# Patient Record
Sex: Male | Born: 1935 | Race: White | Hispanic: No | Marital: Married
Health system: Southern US, Community
[De-identification: ages and names within clinical notes are randomized; demographics above are authoritative.]

## PROBLEM LIST (undated history)

## (undated) DIAGNOSIS — G459 Transient cerebral ischemic attack, unspecified: Secondary | ICD-10-CM

## (undated) DIAGNOSIS — C100 Malignant neoplasm of vallecula: Secondary | ICD-10-CM

## (undated) DIAGNOSIS — R112 Nausea with vomiting, unspecified: Secondary | ICD-10-CM

## (undated) DIAGNOSIS — R590 Localized enlarged lymph nodes: Secondary | ICD-10-CM

## (undated) DIAGNOSIS — C801 Malignant (primary) neoplasm, unspecified: Secondary | ICD-10-CM

## (undated) DIAGNOSIS — H269 Unspecified cataract: Secondary | ICD-10-CM

## (undated) DIAGNOSIS — R21 Rash and other nonspecific skin eruption: Secondary | ICD-10-CM

## (undated) DIAGNOSIS — K1230 Oral mucositis (ulcerative), unspecified: Secondary | ICD-10-CM

## (undated) DIAGNOSIS — Z8673 Personal history of transient ischemic attack (TIA), and cerebral infarction without residual deficits: Secondary | ICD-10-CM

## (undated) DIAGNOSIS — M5430 Sciatica, unspecified side: Secondary | ICD-10-CM

## (undated) DIAGNOSIS — G250 Essential tremor: Secondary | ICD-10-CM

## (undated) DIAGNOSIS — Z9109 Other allergy status, other than to drugs and biological substances: Secondary | ICD-10-CM

## (undated) DIAGNOSIS — E785 Hyperlipidemia, unspecified: Secondary | ICD-10-CM

## (undated) DIAGNOSIS — R630 Anorexia: Secondary | ICD-10-CM

## (undated) DIAGNOSIS — I639 Cerebral infarction, unspecified: Secondary | ICD-10-CM

## (undated) HISTORY — DX: Personal history of transient ischemic attack (TIA), and cerebral infarction without residual deficits: Z86.73

## (undated) HISTORY — DX: Nausea with vomiting, unspecified: R11.2

## (undated) HISTORY — DX: Unspecified cataract: H26.9

## (undated) HISTORY — DX: Rash and other nonspecific skin eruption: R21

## (undated) HISTORY — DX: Cerebral infarction, unspecified: I63.9

## (undated) HISTORY — PX: CATARACT EXTRACTION: SUR2

## (undated) HISTORY — PX: TOOTH EXTRACTION: SUR596

## (undated) HISTORY — DX: Oral mucositis (ulcerative), unspecified: K12.30

## (undated) HISTORY — PX: TONSILLECTOMY: SUR1361

## (undated) HISTORY — DX: Other allergy status, other than to drugs and biological substances: Z91.09

## (undated) HISTORY — PX: VASECTOMY: SHX75

---

## 2010-12-23 ENCOUNTER — Other Ambulatory Visit: Payer: Self-pay | Admitting: Family Medicine

## 2010-12-23 DIAGNOSIS — M545 Low back pain: Secondary | ICD-10-CM

## 2010-12-24 ENCOUNTER — Ambulatory Visit
Admission: RE | Admit: 2010-12-24 | Discharge: 2010-12-24 | Disposition: A | Discharge status: Home / Self Care | Payer: Medicare Other | Source: Ambulatory Visit | Attending: Family Medicine | Admitting: Family Medicine

## 2010-12-24 DIAGNOSIS — M545 Low back pain: Secondary | ICD-10-CM

## 2011-07-01 DIAGNOSIS — Z8673 Personal history of transient ischemic attack (TIA), and cerebral infarction without residual deficits: Secondary | ICD-10-CM

## 2011-07-01 HISTORY — DX: Personal history of transient ischemic attack (TIA), and cerebral infarction without residual deficits: Z86.73

## 2011-07-18 ENCOUNTER — Emergency Department (HOSPITAL_COMMUNITY): Payer: Medicare Other

## 2011-07-18 ENCOUNTER — Encounter (HOSPITAL_COMMUNITY): Payer: Self-pay

## 2011-07-18 ENCOUNTER — Inpatient Hospital Stay (HOSPITAL_COMMUNITY)
Admission: EM | Admit: 2011-07-18 | Discharge: 2011-07-19 | DRG: 065 | Disposition: A | Discharge status: Home / Self Care | Payer: Medicare Other | Attending: Family Medicine | Admitting: Family Medicine

## 2011-07-18 ENCOUNTER — Other Ambulatory Visit: Payer: Self-pay

## 2011-07-18 DIAGNOSIS — G252 Other specified forms of tremor: Secondary | ICD-10-CM | POA: Diagnosis present

## 2011-07-18 DIAGNOSIS — I6350 Cerebral infarction due to unspecified occlusion or stenosis of unspecified cerebral artery: Principal | ICD-10-CM | POA: Diagnosis present

## 2011-07-18 DIAGNOSIS — H532 Diplopia: Secondary | ICD-10-CM | POA: Diagnosis present

## 2011-07-18 DIAGNOSIS — G250 Essential tremor: Secondary | ICD-10-CM | POA: Diagnosis present

## 2011-07-18 DIAGNOSIS — G8190 Hemiplegia, unspecified affecting unspecified side: Secondary | ICD-10-CM | POA: Diagnosis present

## 2011-07-18 DIAGNOSIS — R42 Dizziness and giddiness: Secondary | ICD-10-CM

## 2011-07-18 DIAGNOSIS — E7800 Pure hypercholesterolemia, unspecified: Secondary | ICD-10-CM | POA: Diagnosis present

## 2011-07-18 DIAGNOSIS — E785 Hyperlipidemia, unspecified: Secondary | ICD-10-CM | POA: Diagnosis present

## 2011-07-18 HISTORY — DX: Essential tremor: G25.0

## 2011-07-18 LAB — BASIC METABOLIC PANEL
CO2: 27 mEq/L (ref 19–32)
Chloride: 101 mEq/L (ref 96–112)
Creatinine, Ser: 0.94 mg/dL (ref 0.50–1.35)
GFR calc Af Amer: 85 mL/min — ABNORMAL LOW (ref >90)
Potassium: 4.3 mEq/L (ref 3.5–5.1)
Sodium: 136 mEq/L (ref 135–145)

## 2011-07-18 LAB — CBC
MCV: 98.1 fL (ref 78.0–100.0)
Platelets: 197 10*3/uL (ref 150–400)
RBC: 4.26 MIL/uL (ref 4.22–5.81)
RDW: 12.6 % (ref 11.5–15.5)
WBC: 8.4 10*3/uL (ref 4.0–10.5)

## 2011-07-18 LAB — PROTIME-INR
INR: 1.06 (ref 0.00–1.49)
Prothrombin Time: 14 seconds (ref 11.6–15.2)

## 2011-07-18 LAB — APTT: aPTT: 36 seconds (ref 24–37)

## 2011-07-18 MED ORDER — SODIUM CHLORIDE 0.9 % IV SOLN: 250.0000 mL | INTRAVENOUS | Status: DC | PRN

## 2011-07-18 MED ORDER — ACETAMINOPHEN 650 MG RE SUPP: 650.0000 mg | Freq: Four times a day (QID) | RECTAL | Status: DC | PRN

## 2011-07-18 MED ORDER — ENOXAPARIN SODIUM 40 MG/0.4ML ~~LOC~~ SOLN
40.0000 mg | SUBCUTANEOUS | Status: DC
  Administered 2011-07-18: 40 mg via SUBCUTANEOUS
  Filled 2011-07-18 (×3): qty 0.4

## 2011-07-18 MED ORDER — SODIUM CHLORIDE 0.9 % IJ SOLN
3.0000 mL | Freq: Two times a day (BID) | INTRAMUSCULAR | Status: DC
  Administered 2011-07-18 – 2011-07-19 (×2): 3 mL via INTRAVENOUS

## 2011-07-18 MED ORDER — IOHEXOL 350 MG/ML SOLN
100.0000 mL | Freq: Once | INTRAVENOUS | Status: AC | PRN
  Administered 2011-07-18: 100 mL via INTRAVENOUS

## 2011-07-18 MED ORDER — SODIUM CHLORIDE 0.9 % IJ SOLN: 3.0000 mL | INTRAMUSCULAR | Status: DC | PRN

## 2011-07-18 MED ORDER — SIMVASTATIN 20 MG PO TABS
20.0000 mg | ORAL_TABLET | Freq: Every evening | ORAL | Status: DC
  Administered 2011-07-18: 20 mg via ORAL
  Filled 2011-07-18 (×2): qty 1

## 2011-07-18 MED ORDER — CLOPIDOGREL BISULFATE 75 MG PO TABS
75.0000 mg | ORAL_TABLET | Freq: Every day | ORAL | Status: DC
  Administered 2011-07-19: 75 mg via ORAL
  Filled 2011-07-18 (×2): qty 1

## 2011-07-18 MED ORDER — ACETAMINOPHEN 325 MG PO TABS: 650.0000 mg | ORAL_TABLET | Freq: Four times a day (QID) | ORAL | Status: DC | PRN

## 2011-07-18 MED ORDER — PROPRANOLOL HCL 20 MG PO TABS
20.0000 mg | ORAL_TABLET | Freq: Three times a day (TID) | ORAL | Status: DC
  Administered 2011-07-18 – 2011-07-19 (×2): 20 mg via ORAL
  Filled 2011-07-18 (×4): qty 1

## 2011-07-18 NOTE — ED Notes (Signed)
Called Code Stroke  Per RN, Rolly Salter.   MD Linker

## 2011-07-18 NOTE — H&P (Signed)
Derrick [name] is an 78 y.o. male.   Chief Complaint: double vision and dizziness HPI:  78 yo male with history of hyperlipidemia and benign essential tremor who presents with double vision that started today at 1:30pm. Also noted unsteadiness on his feet at the same time. This occurred acutely. No headache or vomiting. Could not walk due to unsteadiness. Went to the eye doctor shortly after where he was sent to the ED via ambulance. No numbness, tingling, no slurring of speech or trouble swallowing. Double vision resolved with eye patch.    Past Medical History  Diagnosis Date  . Essential tremor   . High cholesterol     History reviewed. No pertinent past surgical history.  History reviewed: sister had stroke, father: diabetes Social History:  reports that he has never smoked. He does not have any smokeless tobacco history on file. He reports that he drinks alcohol occasionally 3-4 glasses of wine or beer per week. He reports that he does not use illicit drugs.  Allergies:  Allergies  Allergen Reactions  . Aspirin Hives, Itching and Swelling   Home Medications: Propranolol 20 mg tid Simvastatin 20mg  daily  Results for orders placed during the hospital encounter of 07/18/11 (from the past 48 hour(s))  CBC     Status: Normal   Collection Time   07/18/11  4:29 PM      Component Value Range Comment   WBC 8.4  4.0 - 10.5 (K/uL)    RBC 4.26  4.22 - 5.81 (MIL/uL)    Hemoglobin 14.4  13.0 - 17.0 (g/dL)    HCT 45.4  09.8 - 11.9 (%)    MCV 98.1  78.0 - 100.0 (fL)    MCH 33.8  26.0 - 34.0 (pg)    MCHC 34.4  30.0 - 36.0 (g/dL)    RDW 14.7  82.9 - 56.2 (%)    Platelets 197  150 - 400 (K/uL)   BASIC METABOLIC PANEL     Status: Abnormal   Collection Time   07/18/11  4:29 PM      Component Value Range Comment   Sodium 136  135 - 145 (mEq/L)    Potassium 4.3  3.5 - 5.1 (mEq/L)    Chloride 101  96 - 112 (mEq/L)    CO2 27  19 - 32 (mEq/L)    Glucose, Bld 138 (*) 70 - 99 (mg/dL)    BUN  16  6 - 23 (mg/dL)    Creatinine, Ser 1.30  0.50 - 1.35 (mg/dL)    Calcium 9.5  8.4 - 10.5 (mg/dL)    GFR calc non Af Amer 74 (*) >90 (mL/min)    GFR calc Af Amer 85 (*) >90 (mL/min)   PROTIME-INR     Status: Normal   Collection Time   07/18/11  4:29 PM      Component Value Range Comment   Prothrombin Time 14.0  11.6 - 15.2 (seconds)    INR 1.06  0.00 - 1.49    APTT     Status: Normal   Collection Time   07/18/11  4:29 PM      Component Value Range Comment   aPTT 36  24 - 37 (seconds)    Ct Angio Head W/cm &/or Wo Cm  07/18/2011  *RADIOLOGY REPORT*  Clinical Data:  Blurred vision and dizziness.  Brainstem stroke.  CT ANGIOGRAPHY HEAD AND NECK  Technique:  Multidetector CT imaging of the head and neck was performed using the standard protocol during  bolus administration of intravenous contrast.  Multiplanar CT image reconstructions including MIPs were obtained to evaluate the vascular anatomy. Carotid stenosis measurements (when applicable) are obtained utilizing NASCET criteria, using the distal internal carotid diameter as the denominator.  Contrast: OMNIPAQUE IOHEXOL 350 MG/ML IV SOLN  Comparison:  CT head 07/18/2011  CTA NECK  Findings:  Extensive cervical disc degeneration and spondylosis throughout the cervical spine.  Cervical facet degeneration is also present.  No acute bony abnormality.  Negative for mass or adenopathy in the neck.  Right carotid:  Right common carotid artery is widely patent.  Mild atherosclerotic calcification in  the proximal right internal carotid artery without significant stenosis.  Atherosclerotic calcification in the cavernous carotid without stenosis.  Left carotid:  Left common carotid artery is widely patent. Carotid bifurcation is widely patent with a very small calcification present in the proximal left internal carotid artery. Mild atherosclerotic calcification in the cavernous carotid.  Vertebral arteries:  Both vertebral arteries are patent to the  basilar.  Right vertebral artery is slightly larger than the right. Hypoplastic basilar due to fetal origin of the posterior cerebral arteries bilaterally.  PICA, and superior cerebellar arteries are patent bilaterally.  Hypoplastic distal basilar artery due to fetal origin of the posterior cerebral arteries.   Review of the MIP images confirms the above findings.  IMPRESSION: No significant carotid or vertebral artery stenosis.  CTA HEAD  Findings:  Generalized atrophy.  No intracranial hemorrhage.  No acute infarct.  Hypoplastic basilar due to fetal origin of the posterior cerebral arteries bilaterally.  Basilar ends in the superior cerebellar arteries bilaterally.  PICA and superior cerebellar arteries are patent bilaterally.  Both posterior cerebral arteries are patent.  Atherosclerotic disease in the cavernous carotid bilaterally which is mild.  No significant carotid stenosis.  Anterior and middle cerebral arteries are patent bilaterally without stenosis.  Negative for cerebral aneurysm.   Review of the MIP images confirms the above findings.  IMPRESSION: No significant intracranial stenosis.  Original Report Authenticated By: Camelia Phenes, M.D.   Ct Head Wo Contrast  07/18/2011  *RADIOLOGY REPORT*  Clinical Data:   Blurred vision and dizziness  CT HEAD WITHOUT CONTRAST  Technique:  Contiguous axial images were obtained from the base of the skull through the vertex without contrast.  Comparison: None.  Findings: Generalized atrophy.  Negative for hemorrhage.  Negative for acute infarct or mass lesion.  Calvarium is intact. Mild chronic sinusitis.  IMPRESSION: Atrophy without acute abnormality.  I discussed the findings by telephone on 07/18/2011 and 1650 hours with Dr. Karma Ganja  Original Report Authenticated By: Camelia Phenes, M.D.   Ct Angio Neck W/cm &/or Wo/cm  07/18/2011  *RADIOLOGY REPORT*  Clinical Data:  Blurred vision and dizziness.  Brainstem stroke.  CT ANGIOGRAPHY HEAD AND NECK  Technique:   Multidetector CT imaging of the head and neck was performed using the standard protocol during bolus administration of intravenous contrast.  Multiplanar CT image reconstructions including MIPs were obtained to evaluate the vascular anatomy. Carotid stenosis measurements (when applicable) are obtained utilizing NASCET criteria, using the distal internal carotid diameter as the denominator.  Contrast: OMNIPAQUE IOHEXOL 350 MG/ML IV SOLN  Comparison:  CT head 07/18/2011  CTA NECK  Findings:  Extensive cervical disc degeneration and spondylosis throughout the cervical spine.  Cervical facet degeneration is also present.  No acute bony abnormality.  Negative for mass or adenopathy in the neck.  Right carotid:  Right common carotid artery is widely  patent.  Mild atherosclerotic calcification in  the proximal right internal carotid artery without significant stenosis.  Atherosclerotic calcification in the cavernous carotid without stenosis.  Left carotid:  Left common carotid artery is widely patent. Carotid bifurcation is widely patent with a very small calcification present in the proximal left internal carotid artery. Mild atherosclerotic calcification in the cavernous carotid.  Vertebral arteries:  Both vertebral arteries are patent to the basilar.  Right vertebral artery is slightly larger than the right. Hypoplastic basilar due to fetal origin of the posterior cerebral arteries bilaterally.  PICA, and superior cerebellar arteries are patent bilaterally.  Hypoplastic distal basilar artery due to fetal origin of the posterior cerebral arteries.   Review of the MIP images confirms the above findings.  IMPRESSION: No significant carotid or vertebral artery stenosis.  CTA HEAD  Findings:  Generalized atrophy.  No intracranial hemorrhage.  No acute infarct.  Hypoplastic basilar due to fetal origin of the posterior cerebral arteries bilaterally.  Basilar ends in the superior cerebellar arteries bilaterally.  PICA and  superior cerebellar arteries are patent bilaterally.  Both posterior cerebral arteries are patent.  Atherosclerotic disease in the cavernous carotid bilaterally which is mild.  No significant carotid stenosis.  Anterior and middle cerebral arteries are patent bilaterally without stenosis.  Negative for cerebral aneurysm.   Review of the MIP images confirms the above findings.  IMPRESSION: No significant intracranial stenosis.  Original Report Authenticated By: Camelia Phenes, M.D.    ROS Negative except per HPI Blood pressure 126/70, pulse 56, temperature 97.7 F (36.5 C), temperature source Oral, resp. rate 17, height 5\' 9"  (1.753 m), weight 145 lb (65.772 kg), SpO2 96.00%. Physical Exam  Constitutional: He is oriented to person, place, and time. He appears well-developed. No distress.  HENT:  Head: Normocephalic.  Mouth/Throat: Oropharynx is clear and moist.  Neck: Normal range of motion. Neck supple.  Cardiovascular: Regular rhythm.        Bradycardic with 2/6 systolic murmur best heard at left sternal border.  Respiratory: Effort normal and breath sounds normal. No respiratory distress. He has no wheezes. He has no rales.  GI: Soft. Bowel sounds are normal. He exhibits no distension. There is no tenderness.  Musculoskeletal: Normal range of motion. He exhibits no edema.  Neurological: He is alert and oriented to person, place, and time.       Alert and oriented to person, time and place Left eye deviated down and out. EOMI. PERRLA. Sensation to light touch intact equally along face. No tongue deviation. Face symmetric. 5/5 strength shoulder shrug.  5/5 strength in upper and lower extremities bilaterally. 1+ patellar reflex bilaterally. Heel to shin intact bilaterally. Finger to nose shows action tremor bilaterally.   Skin: Skin is warm and dry.     Assessment/Plan 78 yo male with h/o hyperlipidemia and essential benign tremor who presented with acute onset diplopia associated with  dizziness and unsteadiness. Acute hemorrhage or intracranial lesion was ruled out by head CT. No evidence of significant intracranial stenosis on CTA. Was out of the TPA window.  1. Stroke workup: - brain MRI  - fasting lipid panel, TSH, A1C - PT/OT and speech eval - 2D echo - will start plavix 75mg  daily since he is allergic to aspirin  2. Essential tremor: - continue propranolol 20mg  tid  3. Hyperlipidemia: - continue simvastatin 20mg  daily  4. Fen/Gi: - carb modified diet - saline locked since no evidence of dehydration 5. PPx: - lovenox 40mg  daily 6. Code  status: - DNR/DNI  7. Dispo: pending workup, patient admitted to tele floor for continuous cardiac monitoring.   Marena Chancy 07/18/2011, 7:42 PM  PGY 3 H&P addendum. I was present and participated in the history physical and writing of this note. I agree with the above.  Zionah Criswell 11:45 PM

## 2011-07-18 NOTE — ED Notes (Signed)
5524-01 Ready 

## 2011-07-18 NOTE — ED Notes (Signed)
Per EMS, pt picked up for eye doctor for c/o of blurred vision and dizziness. Pt denies any pain. Only c/o dizziness when he stands upright. sts the blurred vision is when he looks with both eyes. Eye doc gave him an eye patch and is currently wearing on the left eye. States the blurriness is better when he is only looking through one eye.

## 2011-07-18 NOTE — ED Notes (Signed)
MD at bedside. Family Practice at bedside

## 2011-07-18 NOTE — ED Notes (Signed)
Called Code Stroke  Per RN, Rolly Salter.   MD Karma Ganja,  Called at [phone]

## 2011-07-18 NOTE — Code Documentation (Signed)
Code stroke called at 1628.  PT arrived to ED at 1609, EDP seen at 1627, Stroke team arrived at 1645, arrived at CT at 2, lab arrived at 37 ct read at 81.  Pt was sitting with wife at table eating lunch and pt c/o double vision and dizziness when standing up.  Wife took patient to the eye MD and gave him a patch and called EMS.  NIHSS 0.  Cancelled at [employer]

## 2011-07-18 NOTE — ED Notes (Signed)
3034-01 Ready 

## 2011-07-18 NOTE — ED Notes (Signed)
Code Stroke cancelled per Dr. Lyman Speller.

## 2011-07-18 NOTE — ED Notes (Signed)
PT given lunch box.

## 2011-07-18 NOTE — ED Provider Notes (Signed)
History     CSN: [id number]  Arrival date & time 07/18/11  1609   First MD Initiated Contact with Patient 07/18/11 1612      Chief Complaint  Patient presents with  . Dizziness  . Blurred Vision    (Consider location/radiation/quality/duration/timing/severity/associated sxs/prior treatment) HPI Patient presents with acute onset of double vision and vertigo. Patient states the symptoms came on between 1:30 and 2:00 PM this afternoon. He states that he was standing in the kitchen and the towels on his kitchen wall were double in appearance. He also has had significant sensation of spinning and has had difficulty keeping his balance in standing upright. He went to an eye doctor who apply the patch to his left eye. With one eye covered his vision is normal but with binocular vision he sees double. He has not had this symptom in the past. He has no history of stroke. He does not take any blood thinners. He states he has had intermittent vertigo in the past that last several seconds and then resolves on its own but nothing similar to his symptoms today. He has no headache no difficulty speaking no focal weakness or numbness.  Covering one eye does help with his vision, there are no other allleviating or modifying factors.   Past Medical History  Diagnosis Date  . Essential tremor   . High cholesterol     History reviewed. No pertinent past surgical history.  History reviewed. No pertinent family history.  History  Substance Use Topics  . Smoking status: Never Smoker   . Smokeless tobacco: Not on file  . Alcohol Use: Yes     wine occasionally      Review of Systems ROS reviewed and otherwise negative except for mentioned in HPI  Allergies  Aspirin  Home Medications   No current outpatient prescriptions on file.  BP 125/78  Pulse 57  Temp(Src) 97.6 F (36.4 C) (Oral)  Resp 20  Ht 5\' 9"  (1.753 m)  Wt 156 lb 8.4 oz (71 kg)  BMI 23.11 kg/m2  SpO2 97% Vitals  reviewed Physical Exam Physical Examination: General appearance - alert, well appearing, and in no distress Mental status - alert, oriented to person, place, and time Eyes - pupils equal and reactive, extraocular eye movements intact Mouth - mucous membranes moist, pharynx normal without lesions Chest - clear to auscultation, no wheezes, rales or rhonchi, symmetric air entry Heart - normal rate, regular rhythm, normal S1, S2, no murmurs, rubs, clicks or gallops Abdomen - soft, nontender, nondistended, no masses or organomegaly Neurological - alert, oriented, normal speech, strength 5/5 in extremities x 4, sensation intact in extremities x 4, negative rhomberg, mild disconjugate gaze, otherwise cranial nerves 2-12 tested and intact Musculoskeletal - no joint tenderness, deformity or swelling Extremities - peripheral pulses normal, no pedal edema, no clubbing or cyanosis Skin - normal coloration and turgor, no rashes  ED Course  Procedures (including critical care time) 4:28 PM code stroke paged- immediately upon my initial evaluation of patient  4:53 PM Pt is returning from CT scanner, neurology is at bedside.  Radiology has called with preliminary read of no acute findings on CT scan  4:57 PM CT scan reviewed by me as well  5:13 PM neurology recommends CT angio which he states he has ordered.  If negative, plan for admission to medicine for CVA workup  6:43 PM  CT angio without acute findings, d/w [facility] for admission, they will see in the ED   Date:  07/18/2011  Rate: 52  Rhythm: normal sinus rhythm  QRS Axis: normal  Intervals: normal  ST/T Wave abnormalities: normal  Conduction Disutrbances:none  Narrative Interpretation: poor r wave progression, possible anterior infarct, age undetermined  Old EKG Reviewed: none available  CRITICAL CARE Performed by: Ethelda Chick   Total critical care time: 35  Critical care time was exclusive of separately billable procedures and  treating other patients.  Critical care was necessary to treat or prevent imminent or life-threatening deterioration.  Critical care was time spent personally by me on the following activities: development of treatment plan with patient and/or surrogate as well as nursing, discussions with consultants, evaluation of patient's response to treatment, examination of patient, obtaining history from patient or surrogate, ordering and performing treatments and interventions, ordering and review of laboratory studies, ordering and review of radiographic studies, pulse oximetry and re-evaluation of patient's condition.  Labs Reviewed  BASIC METABOLIC PANEL - Abnormal; Notable for the following:    Glucose, Bld 138 (*)    GFR calc non Af Amer 74 (*)    GFR calc Af Amer 85 (*)    All other components within normal limits  CBC  PROTIME-INR  APTT  HEMOGLOBIN A1C  TSH  BASIC METABOLIC PANEL  CBC  DIFFERENTIAL  LIPID PANEL  LIPID PANEL   Ct Angio Head W/cm &/or Wo Cm  07/18/2011  *RADIOLOGY REPORT*  Clinical Data:  Blurred vision and dizziness.  Brainstem stroke.  CT ANGIOGRAPHY HEAD AND NECK  Technique:  Multidetector CT imaging of the head and neck was performed using the standard protocol during bolus administration of intravenous contrast.  Multiplanar CT image reconstructions including MIPs were obtained to evaluate the vascular anatomy. Carotid stenosis measurements (when applicable) are obtained utilizing NASCET criteria, using the distal internal carotid diameter as the denominator.  Contrast: OMNIPAQUE IOHEXOL 350 MG/ML IV SOLN  Comparison:  CT head 07/18/2011  CTA NECK  Findings:  Extensive cervical disc degeneration and spondylosis throughout the cervical spine.  Cervical facet degeneration is also present.  No acute bony abnormality.  Negative for mass or adenopathy in the neck.  Right carotid:  Right common carotid artery is widely patent.  Mild atherosclerotic calcification in  the  proximal right internal carotid artery without significant stenosis.  Atherosclerotic calcification in the cavernous carotid without stenosis.  Left carotid:  Left common carotid artery is widely patent. Carotid bifurcation is widely patent with a very small calcification present in the proximal left internal carotid artery. Mild atherosclerotic calcification in the cavernous carotid.  Vertebral arteries:  Both vertebral arteries are patent to the basilar.  Right vertebral artery is slightly larger than the right. Hypoplastic basilar due to fetal origin of the posterior cerebral arteries bilaterally.  PICA, and superior cerebellar arteries are patent bilaterally.  Hypoplastic distal basilar artery due to fetal origin of the posterior cerebral arteries.   Review of the MIP images confirms the above findings.  IMPRESSION: No significant carotid or vertebral artery stenosis.  CTA HEAD  Findings:  Generalized atrophy.  No intracranial hemorrhage.  No acute infarct.  Hypoplastic basilar due to fetal origin of the posterior cerebral arteries bilaterally.  Basilar ends in the superior cerebellar arteries bilaterally.  PICA and superior cerebellar arteries are patent bilaterally.  Both posterior cerebral arteries are patent.  Atherosclerotic disease in the cavernous carotid bilaterally which is mild.  No significant carotid stenosis.  Anterior and middle cerebral arteries are patent bilaterally without stenosis.  Negative for cerebral aneurysm.  Review of the MIP images confirms the above findings.  IMPRESSION: No significant intracranial stenosis.  Original Report Authenticated By: Camelia Phenes, M.D.   Ct Head Wo Contrast  07/18/2011  *RADIOLOGY REPORT*  Clinical Data:   Blurred vision and dizziness  CT HEAD WITHOUT CONTRAST  Technique:  Contiguous axial images were obtained from the base of the skull through the vertex without contrast.  Comparison: None.  Findings: Generalized atrophy.  Negative for hemorrhage.   Negative for acute infarct or mass lesion.  Calvarium is intact. Mild chronic sinusitis.  IMPRESSION: Atrophy without acute abnormality.  I discussed the findings by telephone on 07/18/2011 and 1650 hours with Dr. Karma Ganja  Original Report Authenticated By: Camelia Phenes, M.D.   Ct Angio Neck W/cm &/or Wo/cm  07/18/2011  *RADIOLOGY REPORT*  Clinical Data:  Blurred vision and dizziness.  Brainstem stroke.  CT ANGIOGRAPHY HEAD AND NECK  Technique:  Multidetector CT imaging of the head and neck was performed using the standard protocol during bolus administration of intravenous contrast.  Multiplanar CT image reconstructions including MIPs were obtained to evaluate the vascular anatomy. Carotid stenosis measurements (when applicable) are obtained utilizing NASCET criteria, using the distal internal carotid diameter as the denominator.  Contrast: OMNIPAQUE IOHEXOL 350 MG/ML IV SOLN  Comparison:  CT head 07/18/2011  CTA NECK  Findings:  Extensive cervical disc degeneration and spondylosis throughout the cervical spine.  Cervical facet degeneration is also present.  No acute bony abnormality.  Negative for mass or adenopathy in the neck.  Right carotid:  Right common carotid artery is widely patent.  Mild atherosclerotic calcification in  the proximal right internal carotid artery without significant stenosis.  Atherosclerotic calcification in the cavernous carotid without stenosis.  Left carotid:  Left common carotid artery is widely patent. Carotid bifurcation is widely patent with a very small calcification present in the proximal left internal carotid artery. Mild atherosclerotic calcification in the cavernous carotid.  Vertebral arteries:  Both vertebral arteries are patent to the basilar.  Right vertebral artery is slightly larger than the right. Hypoplastic basilar due to fetal origin of the posterior cerebral arteries bilaterally.  PICA, and superior cerebellar arteries are patent bilaterally.  Hypoplastic  distal basilar artery due to fetal origin of the posterior cerebral arteries.   Review of the MIP images confirms the above findings.  IMPRESSION: No significant carotid or vertebral artery stenosis.  CTA HEAD  Findings:  Generalized atrophy.  No intracranial hemorrhage.  No acute infarct.  Hypoplastic basilar due to fetal origin of the posterior cerebral arteries bilaterally.  Basilar ends in the superior cerebellar arteries bilaterally.  PICA and superior cerebellar arteries are patent bilaterally.  Both posterior cerebral arteries are patent.  Atherosclerotic disease in the cavernous carotid bilaterally which is mild.  No significant carotid stenosis.  Anterior and middle cerebral arteries are patent bilaterally without stenosis.  Negative for cerebral aneurysm.   Review of the MIP images confirms the above findings.  IMPRESSION: No significant intracranial stenosis.  Original Report Authenticated By: Camelia Phenes, M.D.     1. Vertigo   2. Diplopia       MDM  Patient presents with acute onset of double vision and vertigo. He states the symptoms began approximately 1:30 PM today. Immediately upon initiating his evaluation a code stroke was called. IV access was obtained. Patient was placed on the monitor. Neurology saw the patient at the bedside after head CT which had no acute findings. Neurologist recommended a CT  NGO to further evaluate patient's posterior circulation. This was also reassuring. Patient was then admitted to medicine service for further evaluation and management and to complete CVA evaluation.        Ethelda Chick, MD 07/20/11 [phone]

## 2011-07-18 NOTE — ED Notes (Signed)
Patient transported to MRI 

## 2011-07-18 NOTE — ED Notes (Signed)
Pt back to room with Stroke RN and Neurologist

## 2011-07-18 NOTE — ED Notes (Signed)
Code Stroke Called 1628 Patient Arrival 1609 EDP exam 1627 Stroke Team arrival 1648 Last seen normal 1400 Pt arrival in CT 1642 Phlebotomist arrival 1629 CT read 1646 Code Stroke Canceled 1702

## 2011-07-18 NOTE — Consult Note (Signed)
Chief Complaint: "double vision and vertigo on standing up"  HPI: Derrick [name] is an 78 y.o. male who comes in with binocular double vision and vertigo that is particularly worse on standing up. Patient initially went to his opthalmologist who gave him an eye patch and sent him to the ED. He was out of the t-PA window when he arrived to ED and his NIHSS was 0.   LSN: 2:40 pm tPA Given: No: out of window and low NIHSS mRankin: 2  Past Medical History  Diagnosis Date  . Essential tremor   . High cholesterol    History reviewed. No pertinent past surgical history.  FH: non-contibutory Social History:  reports that he has never smoked. He does not have any smokeless tobacco history on file. He reports that he drinks alcohol. He reports that he does not use illicit drugs.  Allergies:  Allergies  Allergen Reactions  . Aspirin Hives, Itching and Swelling   Medications: I have reviewed the patient's current medications.  ROS: as above  Physical Examination: Blood pressure 114/56, pulse 59, temperature 97.4 F (36.3 C), temperature source Oral, resp. rate 16, height 5\' 9"  (1.753 m), weight 65.772 kg (145 lb), SpO2 96.00%.  Neurologic Examination: Neurological exam: AAO*3. No aphasia. Followed complex commands. Cranial nerves: left eye - down and out, right eye EOMI, PERRL. Visual fields were full. Sensation to V1 through V3 areas of the face was intact and symmetric throughout. There was no facial asymmetry. Hearing to finger rub was equal and symmetrical bilaterally. Shoulder shrug was 5/5 and symmetric bilaterally. Head rotation was 5/5 bilaterally. There was no dysarthria or palatal deviation.Motor: strength was 5/5 and symmetric throughout. Sensory: was intact throughout to light touch, pinprick. Coordination: finger-to-nose and heel-to-shin were intact bilaterally. Reflexes: were 2+ in upper extremities and 1+ at the knees and 1+ at the ankles. Plantar response was downgoing  bilaterally. Gait: deferred  Results for orders placed during the hospital encounter of 07/18/11 (from the past 48 hour(s))  CBC     Status: Normal   Collection Time   07/18/11  4:29 PM      Component Value Range Comment   WBC 8.4  4.0 - 10.5 (K/uL)    RBC 4.26  4.22 - 5.81 (MIL/uL)    Hemoglobin 14.4  13.0 - 17.0 (g/dL)    HCT 45.4  09.8 - 11.9 (%)    MCV 98.1  78.0 - 100.0 (fL)    MCH 33.8  26.0 - 34.0 (pg)    MCHC 34.4  30.0 - 36.0 (g/dL)    RDW 14.7  82.9 - 56.2 (%)    Platelets 197  150 - 400 (K/uL)   PROTIME-INR     Status: Normal   Collection Time   07/18/11  4:29 PM      Component Value Range Comment   Prothrombin Time 14.0  11.6 - 15.2 (seconds)    INR 1.06  0.00 - 1.49    APTT     Status: Normal   Collection Time   07/18/11  4:29 PM      Component Value Range Comment   aPTT 36  24 - 37 (seconds)    Ct Head Wo Contrast  07/18/2011  *RADIOLOGY REPORT*  Clinical Data:   Blurred vision and dizziness  CT HEAD WITHOUT CONTRAST  Technique:  Contiguous axial images were obtained from the base of the skull through the vertex without contrast.  Comparison: None.  Findings: Generalized atrophy.  Negative for hemorrhage.  Negative for acute infarct or mass lesion.  Calvarium is intact. Mild chronic sinusitis.  IMPRESSION: Atrophy without acute abnormality.  I discussed the findings by telephone on 07/18/2011 and 1650 hours with Dr. Karma Ganja  Original Report Authenticated By: Camelia Phenes, M.D.   Assessment: 78 y.o. male with sudden onset double vision and orthostatic vertigo who was out of t-PA window on arrival and had NIHSS of 0 in ED.   Stroke Risk Factors - HLD  Plan: 1. HgbA1c, fasting lipid panel 2. CTA head and neck to evaluate posterior circulation 3. MRI brain 4. PT consult, OT consult, Speech consult 5. Echocardiogram 6. Carotid dopplers 7. Prophylactic therapy-Antiplatelet med: Plavix - dose 75 mg PO daily - he has allergy to ASA with hives 8. Risk factor  modification 9. Cardiac Monitoring 10. Neurochecks q4h 11. DVT prophylaxis 12. HOB at 30 degrees 13. Keep MAP b/w 120 to 130 degrees 14. IV fluids 15. NPO until Nursing swallow eval, then heart healthy diet  Derrick [name] 07/18/2011, 5:06 PM

## 2011-07-19 DIAGNOSIS — I6350 Cerebral infarction due to unspecified occlusion or stenosis of unspecified cerebral artery: Secondary | ICD-10-CM

## 2011-07-19 LAB — DIFFERENTIAL
Eosinophils Absolute: 0.2 10*3/uL (ref 0.0–0.7)
Lymphs Abs: 3.2 10*3/uL (ref 0.7–4.0)
Monocytes Relative: 14 % — ABNORMAL HIGH (ref 3–12)
Neutro Abs: 4.1 10*3/uL (ref 1.7–7.7)
Neutrophils Relative %: 47 % (ref 43–77)

## 2011-07-19 LAB — LIPID PANEL
Cholesterol: 132 mg/dL (ref 0–200)
HDL: 52 mg/dL (ref >39)
HDL: 52 mg/dL (ref >39)
LDL Cholesterol: 60 mg/dL (ref 0–99)
LDL Cholesterol: 61 mg/dL (ref 0–99)
Total CHOL/HDL Ratio: 2.5 RATIO
Triglycerides: 96 mg/dL (ref <150)
VLDL: 19 mg/dL (ref 0–40)

## 2011-07-19 LAB — BASIC METABOLIC PANEL
Calcium: 9.6 mg/dL (ref 8.4–10.5)
GFR calc Af Amer: 71 mL/min — ABNORMAL LOW (ref >90)
GFR calc non Af Amer: 61 mL/min — ABNORMAL LOW (ref >90)
Glucose, Bld: 96 mg/dL (ref 70–99)
Potassium: 4.2 mEq/L (ref 3.5–5.1)
Sodium: 139 mEq/L (ref 135–145)

## 2011-07-19 LAB — TSH: TSH: 2.101 u[IU]/mL (ref 0.350–4.500)

## 2011-07-19 LAB — HEMOGLOBIN A1C
Hgb A1c MFr Bld: 6.3 % — ABNORMAL HIGH (ref <5.7)
Mean Plasma Glucose: 134 mg/dL — ABNORMAL HIGH (ref <117)

## 2011-07-19 LAB — CBC
Hemoglobin: 13.5 g/dL (ref 13.0–17.0)
Platelets: 190 10*3/uL (ref 150–400)
RBC: 4.11 MIL/uL — ABNORMAL LOW (ref 4.22–5.81)
WBC: 8.7 10*3/uL (ref 4.0–10.5)

## 2011-07-19 MED ORDER — CLOPIDOGREL BISULFATE 75 MG PO TABS: 75.0000 mg | ORAL_TABLET | Freq: Every day | ORAL | Status: DC

## 2011-07-19 MED ORDER — PROPRANOLOL HCL 20 MG PO TABS: 20.0000 mg | ORAL_TABLET | Freq: Every day | ORAL | Status: DC

## 2011-07-19 NOTE — Progress Notes (Signed)
OT Discharge Note  Patient is being discharged from OT services secondary to:    Goals met and no further therapy needs identified.  Please see latest Therapy Progress Note for current level of functioning and progress toward goals.  Progress and discharge plan and discussed with patient/caregiver and they    Agree    Johnston, Kazia Grisanti Brynn   OTR/L Pager: [phone] Office: [phone] .     

## 2011-07-19 NOTE — Discharge Instructions (Signed)
You were hospitalized for a stroke that most likely caused the double vision and dizziness you were feeling. The MRI showed tiny acute infarcts in the right posterior pons which could have explained your symptoms. We started you on plavix since you are allergic to aspirin. You can take plavix every day.  Your cholesterol was within normal range, which tells [place] that the simvastatin is working well.  STROKE/TIA DISCHARGE INSTRUCTIONS SMOKING Cigarette smoking nearly doubles your risk of having a stroke & is the single most alterable risk factor  If you smoke or have smoked in the last 12 months, you are advised to quit smoking for your health.  Most of the excess cardiovascular risk related to smoking disappears within a year of stopping.  Ask you doctor about anti-smoking medications  [place] Quit Line: 1-800-QUIT NOW  Free Smoking Cessation Classes [phone]  CHOLESTEROL Know your levels; limit fat & cholesterol in your diet  Lipid Panel     Component Value Date/Time   CHOL 132 07/19/2011 0615   TRIG 96 07/19/2011 0615   HDL 52 07/19/2011 0615   CHOLHDL 2.5 07/19/2011 0615   VLDL 19 07/19/2011 0615   LDLCALC 61 07/19/2011 0615      Many patients benefit from treatment even if their cholesterol is at goal.  Goal: Total Cholesterol (CHOL) less than 160  Goal:  Triglycerides (TRIG) less than 150  Goal:  HDL greater than 40  Goal:  LDL (LDLCALC) less than 100   BLOOD PRESSURE American Stroke Association blood pressure target is less that 120/80 mm/Hg  Your discharge blood pressure is:  BP: 91/52 mmHg  Monitor your blood pressure  Limit your salt and alcohol intake  Many individuals will require more than one medication for high blood pressure  DIABETES (A1c is a blood sugar average for last 3 months) Goal HGBA1c is under 7% (HBGA1c is blood sugar average for last 3 months)  Diabetes: {STROKE DC DIABETES:22357}    Lab Results  Component Value Date   HGBA1C 6.3* 07/19/2011      Your HGBA1c can be lowered with medications, healthy diet, and exercise.  Check your blood sugar as directed by your physician  Call your physician if you experience unexplained or low blood sugars.  PHYSICAL ACTIVITY/REHABILITATION Goal is 30 minutes at least 4 days per week    {STROKE DC ACTIVITY/REHAB:22359}  Activity decreases your risk of heart attack and stroke and makes your heart stronger.  It helps control your weight and blood pressure; helps you relax and can improve your mood.  Participate in a regular exercise program.  Talk with your doctor about the best form of exercise for you (dancing, walking, swimming, cycling).  DIET/WEIGHT Goal is to maintain a healthy weight  Your discharge diet is: General *** liquids Your height is:  Height: 5\' 9"  (175.3 cm) Your current weight is: Weight: 71 kg (156 lb 8.4 oz) Your Body Mass Index (BMI) is:  BMI (Calculated): 23.2   Following the type of diet specifically designed for you will help prevent another stroke.  Your goal weight range is:  ***  Your goal Body Mass Index (BMI) is 19-24.  Healthy food habits can help reduce 3 risk factors for stroke:  High cholesterol, hypertension, and excess weight.  RESOURCES Stroke/Support Group:  Call [phone]  they meet the 3rd Sunday of the month on the Rehab Unit at [facility] [place], [place] ( no meetings June, July & Aug).  STROKE EDUCATION PROVIDED/REVIEWED AND GIVEN TO PATIENT  Stroke warning signs and symptoms How to activate emergency medical system (call 911). Medications prescribed at discharge. Need for follow-up after discharge. Personal risk factors for stroke. Pneumonia vaccine given:   {STROKE DC YES/NO/DATE:22363} Flu vaccine given:   {STROKE DC YES/NO/DATE:22363} My questions have been answered, the writing is legible, and I understand these instructions.  I will adhere to these goals & educational materials that have been provided to me after my discharge from the hospital.

## 2011-07-19 NOTE — Evaluation (Signed)
Occupational Therapy Evaluation Patient Details Name: Derrick [name] MRN: [id number] DOB: [birth year] Today's Date: 07/19/2011  Problem List: There is no problem list on file for this patient.   Past Medical History:  Past Medical History  Diagnosis Date  . Essential tremor   . High cholesterol    Past Surgical History: History reviewed. No pertinent past surgical history.  OT Assessment/Plan/Recommendation OT Assessment Clinical Impression Statement: 78 yo male arriving to ED with diplopia which has since resolved. Pt is at or near baseline and does not required acute OT OT Recommendation/Assessment: Patient does not need any further OT services OT Recommendation Follow Up Recommendations: No OT follow up Equipment Recommended: None recommended by PT OT Goals    OT Evaluation Precautions/Restrictions  Precautions Precautions: Fall Precaution Comments: needs supervision with ambulation initially Restrictions Weight Bearing Restrictions: No Prior Functioning Home Living Lives With: Spouse;Other (Comment) Type of Home: House Home Layout: Two level;1/2 bath on main level;Other (Comment) Alternate Level Stairs-Rails: Left Alternate Level Stairs-Number of Steps: 12 Home Access: Stairs to enter Entrance Stairs-Rails: None Entrance Stairs-Number of Steps: 2 Bathroom Shower/Tub: Tub/shower unit;Curtain;Other (comment) Bathroom Toilet: Standard Home Adaptive Equipment: Walker - four wheeled;Other (comment) Additional Comments: sacilic issue from july to december, pt used rollator during this time Prior Function Level of Independence: Independent with basic ADLs;Independent with transfers;Independent with gait Able to Take Stairs?: Reciprically Driving: Yes 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 , work on the [employer], babysit grandchildren, go to movies, go out to eat, moved to [place] in 2006, 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 pt able to turn in less than 4 seconds, look up , look down, look side to side, increase speed / decrease speed without LOB. Pt is at or near baseline for ADLS ADL Comments: Pt reports at or near baseline Vision/Perception  Vision - History Baseline Vision: No visual deficits Patient Visual Report: No change from baseline (states its back to normal. ) Vision - Assessment Eye Alignment: Within Functional Limits Cognition Cognition Arousal/Alertness: Awake/alert Overall Cognitive Status: Appears within functional limits for tasks assessed Orientation Level: Oriented X4 Cognition - Other Comments: Above expected level for age. Previous college professor.  Sensation/Coordination Sensation Light Touch: Appears Intact Coordination Gross Motor Movements are Fluid and Coordinated: No Fine Motor Movements are Fluid and Coordinated: Yes (tie shoes with tremor) Coordination and Movement Description: Pt with intentional tremor, occasional resting tremor primarily effecting UEs Extremity Assessment RUE Assessment RUE Assessment: Within Functional Limits LUE Assessment LUE Assessment: Within Functional Limits Mobility  Bed Mobility Bed Mobility: Yes Supine to Sit: 6: Modified  independent (Device/Increase time) Transfers Transfers: Yes Sit to Stand: 4: Min assist;5: Supervision;From chair/3-in-1;From bed Sit to Stand Details (indicate cue type and reason): Min assist from bed (1st attempt); supervision from chair (2nd attempt) Stand to Sit: 6: Modified independent (Device/Increase time);To chair/3-in-1 Exercises   End of Session OT - End of Session  Equipment Utilized During Treatment: Gait belt Activity Tolerance: Patient tolerated treatment well Patient left: in chair;with call bell in reach;with family/visitor present (wife) Nurse Communication: Mobility status for transfers;Mobility status for ambulation General Behavior During Session: [facility] for tasks performed Cognition: [facility] for tasks performed   Lucile Shutters 07/19/2011, 3:44 PM  Pager: [phone]

## 2011-07-19 NOTE — Progress Notes (Signed)
Family Medicine Teaching Service Attending Note  I interviewed and examined patient Derrick [name] and reviewed their tests and x-rays.  I discussed with Dr. Gwenlyn Saran and reviewed their note for today.  I agree with their assessment and plan.     Additionally  See my admission Cosign

## 2011-07-19 NOTE — Progress Notes (Signed)
Patient D/C instructions and [place] education provided. Patient had no further questions. D/C'd home with no signs of acute distress. Irene Pap, RN

## 2011-07-19 NOTE — Evaluation (Signed)
Physical Therapy Evaluation Patient Details Name: Derrick [name] MRN: [id number] DOB: 12/30/1922 Today's Date: 07/19/2011  Problem List: There is no problem list on file for this patient.   Past Medical History:  Past Medical History  Diagnosis Date  . Essential tremor   . High cholesterol    Past Surgical History: History reviewed. No pertinent past surgical history.  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 Pt reporting he is nearly back at baseline, vision deficits resolved. Pt with only minor balance deficits however did very well. May benefit from supervision with gait initially. Will work with pt to return fully to baseline during acute stay.  PT Recommendation/Assessment: Patient will need skilled PT in the acute care venue PT Problem List: Decreased balance Barriers to Discharge: None PT Therapy Diagnosis : Difficulty walking;Abnormality of gait PT Plan PT Frequency: Min 3X/week PT Treatment/Interventions: Gait training;Stair training;Functional mobility training;Therapeutic activities;Therapeutic exercise;Balance training;Patient/family education PT Recommendation Follow Up Recommendations: Other (comment) (pt declining, may benefit from outpatient PT) Equipment Recommended: None recommended by PT PT Goals  Acute Rehab PT Goals PT Goal Formulation: With patient Pt will go Sit to Stand: with modified independence PT Goal: Sit to Stand - Progress: Goal set today Pt will go Stand to Sit: with modified independence PT Goal: Stand to Sit - Progress: Goal set today Pt will Ambulate: >150 feet;with modified independence PT Goal: Ambulate - Progress: Goal set today Pt will Go Up / Down Stairs: 1-2 stairs;with modified independence;with rail(s) PT Goal: Up/Down Stairs - Progress: Goal set today  PT Evaluation Precautions/Restrictions   Precautions Precautions: Fall Precaution Comments: needs supervision with ambulation initially Restrictions Weight Bearing Restrictions: No Prior Functioning  Home Living Lives With: Spouse;Other (Comment) (walks dog daily) Type of Home: House Home Layout: Two level;1/2 bath on main level;Other (Comment) (sofa on main floor) Alternate Level Stairs-Rails: Left Alternate Level Stairs-Number of Steps: 12 Home Access: Stairs to enter Entrance Stairs-Rails: None Entrance Stairs-Number of Steps: 2 (sliding door to enter) Bathroom Shower/Tub: Tub/shower unit;Curtain;Other (comment) (holds onto towel rack) Bathroom Toilet: Standard Home Adaptive Equipment: Dan Humphreys - four wheeled;Other (comment) (no longer using but has in the building, corset brace ) Additional Comments: sacilic issue from july to december, pt used rollator during this time Prior Function Level of Independence: Independent with basic ADLs;Independent with transfers;Independent with gait Able to Take Stairs?: Reciprically Driving: Yes 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 Previous college professor.  Sensation/Coordination Sensation Light Touch: Appears Intact Coordination Gross Motor Movements are Fluid and Coordinated: No Coordination and Movement Description: Pt with intentional tremor, occasional resting tremor primarily effecting UEs Extremity  Assessment RLE Assessment RLE Assessment: Within Functional Limits LLE Assessment LLE Assessment: Within Functional Limits Mobility (including Balance) Bed Mobility Bed Mobility: Yes Supine to Sit: 6: Modified independent (Device/Increase time) Transfers Sit to Stand: 4: Min assist;5: Supervision;From chair/3-in-1;From bed Sit to Stand Details (indicate cue type and reason): Min assist  from bed (1st attempt); supervision from chair (2nd attempt) Stand to Sit: 6: Modified independent (Device/Increase time);To chair/3-in-1 Ambulation/Gait Ambulation/Gait: Yes Ambulation/Gait Assistance: 5: Supervision Ambulation/Gait Assistance Details (indicate cue type and reason): Supervision for safety. Pt with initially shuffling gait (pt reports he is going to take it slow initially). Pt with decreased trunk rotation, arms rigid by side. No overt losses of balance noted.  Ambulation Distance (Feet): 300 Feet (minimum) Assistive device: None Gait Pattern: Step-through pattern;Decreased stride length Stairs: No  Posture/Postural Control Posture/Postural Control: No significant limitations Balance Balance Assessed: Yes Static Standing Balance Static Standing - Balance Support: No upper extremity supported Static Standing - Level of Assistance: 5: Stand by assistance Static Standing - Comment/# of Minutes: Performed narrow base stance Rhomberg - Eyes Closed: 30  Dynamic Standing Balance Dynamic Standing - Balance Support: No upper extremity supported Dynamic Standing - Level of Assistance: 5: Stand by assistance Dynamic Standing - Balance Activities: Lateral lean/weight shifting High Level Balance High Level Balance Activites: Direction changes;Turns;Sudden stops;Head turns;Other (comment) High Level Balance Comments: Fast walking (pt almost reaching a trot) then slow walking - pt has good change of speed with no balance loss. Pt also able to negotiate obstacles although performs at slower speed  than typical gait.  End of Session PT - End of Session Equipment Utilized During Treatment: Gait belt Activity Tolerance: Patient tolerated treatment well Patient left: in chair;with family/visitor present (wife to get nurse to give pt close to callbell if she leaves) Nurse Communication: Mobility status for transfers  Sherie Don 07/19/2011, 1:24 PM  Sherie Don) Carleene Mains PT, DPT Acute Rehabilitation [phone]

## 2011-07-19 NOTE — Progress Notes (Signed)
  Echocardiogram 2D Echocardiogram has been performed.  Derrick [name], Real Cons 07/19/2011, 2:37 PM

## 2011-07-19 NOTE — Evaluation (Signed)
Clinical/Bedside Swallow Evaluation Patient Details  Name: Derrick [name] MRN: [id number] DOB: 02-10-1923 Today's Date: 07/19/2011  Past Medical History:  Past Medical History  Diagnosis Date  . Essential tremor   . High cholesterol    Past Surgical History: History reviewed. No pertinent past surgical history. HPI:  78 yr old admitted with double vision and dizziness.  MRI revealed a tiny acute infarct in the right pons.   Assessment/Recommendations/Treatment Plan   SLP Assessment Clinical Impression Statement: Pt. exhibited normal oropharyngeal phases of swallowing with po's consumed.  Pt. is at higher risk given pontine site of lesion.  SLP recommends a regular texture diet and thin liquids with close adherence to safe swallow precautions. Risk for Aspiration: Moderate  Swallow Evaluation Recommendations Solid Consistency: Regular Liquid Consistency: Thin  Liquid Administration via: Cup Medication Administration: Whole meds with liquid Supervision: Intermittent supervision to cue for compensatory strategies Compensations: Slow rate;Small sips/bites Postural Changes and/or Swallow Maneuvers: Seated upright 90 degrees Recommendations for Other Services: PT consult Follow up Recommendations: None  Treatment Plan Treatment Plan Recommendations: Therapy as outlined in treatment plan below Speech Therapy Frequency: min 1 x/week Treatment Duration: 2 weeks Interventions: Aspiration precaution training;Compensatory techniques;Patient/family education;Diet toleration management by SLP  Prognosis Prognosis for Safe Diet Advancement: Good  Individuals Consulted Consulted and Agree with Results and Recommendations: Patient  Swallowing Goals  SLP Swallowing Goals Patient will consume recommended diet without observed clinical signs of aspiration with: Modified independent assistance Patient will utilize recommended strategies during swallow to increase swallowing safety with:  Modified independent assistance  Swallow Study General  Date of Onset: 07/18/11 HPI: 78 yr old admitted with double vision and dizziness.  MRI revealed a tiny acute infarct in the right pons. Type of Study: Bedside swallow evaluation Diet Prior to this Study: NPO Respiratory Status: Room air Behavior/Cognition: Alert;Cooperative;Pleasant mood Oral Cavity - Dentition: Adequate natural dentition Vision: Functional for self-feeding Patient Positioning: Upright in bed Baseline Vocal Quality: Clear Volitional Cough: Strong Volitional Swallow: Able to elicit  Oral Motor/Sensory Function  Overall Oral Motor/Sensory Function: Appears within functional limits for tasks assessed  Consistency Results  Ice Chips Ice chips: Not tested  Thin Liquid Thin Liquid: Within functional limits Presentation: Cup;Straw  Nectar Thick Liquid Nectar Thick Liquid: Not tested  Honey Thick Liquid Honey Thick Liquid: Not tested  Puree Puree: Within functional limits  Solid Solid: Within functional limits   Royce Macadamia M.Ed [employer] [phone]  07/19/2011

## 2011-07-19 NOTE — H&P (Signed)
Family Medicine Teaching Service Attending Note  I interviewed and examined patient Campo and reviewed their tests and x-rays.  I discussed with Dr. Denyse Amass and reviewed their note for today.  I agree with their assessment and plan.     Additionally  This AM feels nearly back to normal.  Only very mild dble vision on extreme of gaze Rightward.  Stood and did not feel dizzy.  Continue CVA work up   If can do ADLs ok to discharge today on plavix

## 2011-07-19 NOTE — Progress Notes (Signed)
Stroke Team Progress Note  HISTORY Derrick [name] is an 78 y.o. male who comes in with binocular double vision and vertigo that is particularly worse on standing up. Patient initially went to his opthalmologist who gave him an eye patch and sent him to the ED. He was out of the t-PA window when he arrived to ED and his NIHSS was 0.   SUBJECTIVE No family is at the bedside. Overall he feels his condition is gradually improving. States double vision has resolved overnight. Patient is allergic to aspirin. Pt married, lives with [place] lady. Has 2 daughters in town and 1 son in [place].  OBJECTIVE Filed Vitals:   07/19/11 0200 07/19/11 0400 07/19/11 0600 07/19/11 0900  BP: 100/60 100/63 98/64 128/74  Pulse: 64 63 61 61  Temp: 98.4 F (36.9 C) 97.8 F (36.6 C) 98.3 F (36.8 C) 97.5 F (36.4 C)  TempSrc: Oral Oral Oral Oral  Resp: 16 17 19 18   Height:      Weight:      SpO2: 94% 94% 95% 95%   CBG (last 3) No results found for this basename: GLUCAP:3 in the last 72 hours Intake/Output from previous day:   IV Fluid Intake:    Medications    . clopidogrel  75 mg Oral Q breakfast  . enoxaparin  40 mg Subcutaneous Q24H  . propranolol  20 mg Oral TID  . simvastatin  20 mg Oral QPM  . sodium chloride  3 mL Intravenous Q12H  PRN sodium chloride, acetaminophen, acetaminophen, iohexol, sodium chloride  Diet:  General thin liquids Activity:   Up with assistance DVT Prophylaxis:  Lovenox 40 mg sq daily   Significant Diagnostic Studies: CBC    Component Value Date/Time   WBC 8.7 07/19/2011 0615   RBC 4.11* 07/19/2011 0615   HGB 13.5 07/19/2011 0615   HCT 40.1 07/19/2011 0615   PLT 190 07/19/2011 0615   MCV 97.6 07/19/2011 0615   MCH 32.8 07/19/2011 0615   MCHC 33.7 07/19/2011 0615   RDW 12.6 07/19/2011 0615   LYMPHSABS 3.2 07/19/2011 0615   MONOABS 1.2* 07/19/2011 0615   EOSABS 0.2 07/19/2011 0615   BASOSABS 0.0 07/19/2011 0615   CMP    Component Value Date/Time   NA 139 07/19/2011 0615   K  4.2 07/19/2011 0615   CL 103 07/19/2011 0615   CO2 28 07/19/2011 0615   GLUCOSE 96 07/19/2011 0615   BUN 15 07/19/2011 0615   CREATININE 1.06 07/19/2011 0615   CALCIUM 9.6 07/19/2011 0615   GFRNONAA 61* 07/19/2011 0615   GFRAA 71* 07/19/2011 0615   COAGS Lab Results  Component Value Date   INR 1.06 07/18/2011   Lipid Panel    Component Value Date/Time   CHOL 132 07/19/2011 0615   TRIG 96 07/19/2011 0615   HDL 52 07/19/2011 0615   CHOLHDL 2.5 07/19/2011 0615   VLDL 19 07/19/2011 0615   LDLCALC 61 07/19/2011 0615   HgbA1C  No results found for this basename: HGBA1C   Urine Drug Screen  No results found for this basename: labopia,  cocainscrnur,  labbenz,  amphetmu,  thcu,  labbarb    Alcohol Level No results found for this basename: eth   CT of the brain   Atrophy without acute abnormality.   CT angio head: No significant intracranial stenosis.  CT angio neck   No significant carotid or vertebral artery stenosis.    MRI of the brain  Suggestion of tiny acute infarct right posterior  pons possibly at the third cranial nerve level contributing to the patient's diplopia.  MRA of the brain  See CT angio  2D Echocardiogram  ordered   Carotid Doppler  See CT angio  CXR  Not ordered   EKG  normal sinus rhythm.   Physical Exam   Pleasant elderly Caucasian male currently not in distress. Afebrile. Head is not dramatic. Neck is supple without bruit. Hearing is mildly decreased bilaterally. Cardiac exam no murmur or gallop. Lungs clear to auscultation. Abdomen soft nontender Neurological exam awake alert oriented to time place and person. Normal speech and language function. Extraocular movements are full range without nystagmus. Face is symmetric without weakness. Tongue is midline. Motor system exam reveals no upper or lower extremity drift. Mildly diminished fine finger movements on the left. Orbits right over left approximately. No focal weakness. Normal sensation and coordination. Gait  was not tested.mild bilateral upper extremity action tremor which improved with rest and are benign essential. ASSESSMENT Mr. Derrick [name] is a 78 y.o. 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 Workup underway. On clopidogrel 75 mg orally every day for secondary stroke prevention.  -hypercholesterolemia, on zocor -mild left hemiparesis secondary to stroke -essential tremor mild and stable and longstanding  Hospital day # 1  TREATMENT/PLAN Continue clopidogrel 75 mg orally every day for secondary stroke prevention.  -follow up 2D echo, Hgba1C -therapy evals -once above done, ok for discharge. Follow up with Dr. Pearlean Brownie in 2 mos.  Joaquin Music, ANP-BC, GNP-BC [facility] Pager: [phone] 07/19/2011 10:12 AM  Dr. Delia Heady, Stroke Center Medical Director, has personally reviewed chart, pertinent data, examined the patient and developed the plan of care.

## 2011-07-19 NOTE — Progress Notes (Signed)
Subjective: Feeling very well this morning. Diplopia has almost completely resolved. No nausea/vomiting. Has not tried walking yet, but did not have any dizziness when he sat at edge of bed.   Objective: Vital signs in last 24 hours: Temp:  [97.4 F (36.3 C)-98.4 F (36.9 C)] 97.5 F (36.4 C) (02/19 0900) Pulse Rate:  [54-70] 61  (02/19 0900) Resp:  [16-20] 18  (02/19 0900) BP: (98-155)/(54-89) 128/74 mmHg (02/19 0900) SpO2:  [94 %-97 %] 95 % (02/19 0900) Weight:  [145 lb (65.772 kg)-156 lb 8.4 oz (71 kg)] 156 lb 8.4 oz (71 kg) (02/18 2200) Weight change:  Last BM Date: 07/18/11  Intake/Output from previous day:   Intake/Output this shift: Total I/O In: 3 [I.V.:3] Out: -   General appearance: alert, cooperative, appears stated age and no distress Resp: clear to auscultation bilaterally Cardio: S1S2, 2/6 systolic murmur best heard at left sternal border, mildly bradycardic GI: soft, non-tender; bowel sounds normal; no masses,  no organomegaly Neurologic: a+ox3, CN2-12 grossly intact: left eye movement normal compared to yesterday. 5/5 strength in lower and upper extremities bilaterally, heel to shin intact, tremor with finger to nose  Lab Results:  Basename 07/19/11 0615 07/18/11 1629  WBC 8.7 8.4  HGB 13.5 14.4  HCT 40.1 41.8  PLT 190 197   BMET  Basename 07/19/11 0615 07/18/11 1629  NA 139 136  K 4.2 4.3  CL 103 101  CO2 28 27  GLUCOSE 96 138*  BUN 15 16  CREATININE 1.06 0.94  CALCIUM 9.6 9.5    Studies/Results: Ct Angio Head W/cm &/or Wo Cm  07/18/2011  *RADIOLOGY REPORT*  Clinical Data:  Blurred vision and dizziness.  Brainstem stroke.  CT ANGIOGRAPHY HEAD AND NECK  Technique:  Multidetector CT imaging of the head and neck was performed using the standard protocol during bolus administration of intravenous contrast.  Multiplanar CT image reconstructions including MIPs were obtained to evaluate the vascular anatomy. Carotid stenosis measurements (when  applicable) are obtained utilizing NASCET criteria, using the distal internal carotid diameter as the denominator.  Contrast: OMNIPAQUE IOHEXOL 350 MG/ML IV SOLN  Comparison:  CT head 07/18/2011  CTA NECK  Findings:  Extensive cervical disc degeneration and spondylosis throughout the cervical spine.  Cervical facet degeneration is also present.  No acute bony abnormality.  Negative for mass or adenopathy in the neck.  Right carotid:  Right common carotid artery is widely patent.  Mild atherosclerotic calcification in  the proximal right internal carotid artery without significant stenosis.  Atherosclerotic calcification in the cavernous carotid without stenosis.  Left carotid:  Left common carotid artery is widely patent. Carotid bifurcation is widely patent with a very small calcification present in the proximal left internal carotid artery. Mild atherosclerotic calcification in the cavernous carotid.  Vertebral arteries:  Both vertebral arteries are patent to the basilar.  Right vertebral artery is slightly larger than the right. Hypoplastic basilar due to fetal origin of the posterior cerebral arteries bilaterally.  PICA, and superior cerebellar arteries are patent bilaterally.  Hypoplastic distal basilar artery due to fetal origin of the posterior cerebral arteries.   Review of the MIP images confirms the above findings.  IMPRESSION: No significant carotid or vertebral artery stenosis.  CTA HEAD  Findings:  Generalized atrophy.  No intracranial hemorrhage.  No acute infarct.  Hypoplastic basilar due to fetal origin of the posterior cerebral arteries bilaterally.  Basilar ends in the superior cerebellar arteries bilaterally.  PICA and superior cerebellar arteries are patent  bilaterally.  Both posterior cerebral arteries are patent.  Atherosclerotic disease in the cavernous carotid bilaterally which is mild.  No significant carotid stenosis.  Anterior and middle cerebral arteries are patent bilaterally  without stenosis.  Negative for cerebral aneurysm.   Review of the MIP images confirms the above findings.  IMPRESSION: No significant intracranial stenosis.  Original Report Authenticated By: Camelia Phenes, M.D.   Ct Head Wo Contrast  07/18/2011  *RADIOLOGY REPORT*  Clinical Data:   Blurred vision and dizziness  CT HEAD WITHOUT CONTRAST  Technique:  Contiguous axial images were obtained from the base of the skull through the vertex without contrast.  Comparison: None.  Findings: Generalized atrophy.  Negative for hemorrhage.  Negative for acute infarct or mass lesion.  Calvarium is intact. Mild chronic sinusitis.  IMPRESSION: Atrophy without acute abnormality.  I discussed the findings by telephone on 07/18/2011 and 1650 hours with Dr. Karma Ganja  Original Report Authenticated By: Camelia Phenes, M.D.   Ct Angio Neck W/cm &/or Wo/cm  07/18/2011  *RADIOLOGY REPORT*  Clinical Data:  Blurred vision and dizziness.  Brainstem stroke.  CT ANGIOGRAPHY HEAD AND NECK  Technique:  Multidetector CT imaging of the head and neck was performed using the standard protocol during bolus administration of intravenous contrast.  Multiplanar CT image reconstructions including MIPs were obtained to evaluate the vascular anatomy. Carotid stenosis measurements (when applicable) are obtained utilizing NASCET criteria, using the distal internal carotid diameter as the denominator.  Contrast: OMNIPAQUE IOHEXOL 350 MG/ML IV SOLN  Comparison:  CT head 07/18/2011  CTA NECK  Findings:  Extensive cervical disc degeneration and spondylosis throughout the cervical spine.  Cervical facet degeneration is also present.  No acute bony abnormality.  Negative for mass or adenopathy in the neck.  Right carotid:  Right common carotid artery is widely patent.  Mild atherosclerotic calcification in  the proximal right internal carotid artery without significant stenosis.  Atherosclerotic calcification in the cavernous carotid without stenosis.   Left carotid:  Left common carotid artery is widely patent. Carotid bifurcation is widely patent with a very small calcification present in the proximal left internal carotid artery. Mild atherosclerotic calcification in the cavernous carotid.  Vertebral arteries:  Both vertebral arteries are patent to the basilar.  Right vertebral artery is slightly larger than the right. Hypoplastic basilar due to fetal origin of the posterior cerebral arteries bilaterally.  PICA, and superior cerebellar arteries are patent bilaterally.  Hypoplastic distal basilar artery due to fetal origin of the posterior cerebral arteries.   Review of the MIP images confirms the above findings.  IMPRESSION: No significant carotid or vertebral artery stenosis.  CTA HEAD  Findings:  Generalized atrophy.  No intracranial hemorrhage.  No acute infarct.  Hypoplastic basilar due to fetal origin of the posterior cerebral arteries bilaterally.  Basilar ends in the superior cerebellar arteries bilaterally.  PICA and superior cerebellar arteries are patent bilaterally.  Both posterior cerebral arteries are patent.  Atherosclerotic disease in the cavernous carotid bilaterally which is mild.  No significant carotid stenosis.  Anterior and middle cerebral arteries are patent bilaterally without stenosis.  Negative for cerebral aneurysm.   Review of the MIP images confirms the above findings.  IMPRESSION: No significant intracranial stenosis.  Original Report Authenticated By: Camelia Phenes, M.D.   Mr Brain Wo Contrast  07/19/2011  *RADIOLOGY REPORT*  Clinical Data: Hyperlipidemia.  Double vision.  Tremor.  MRI HEAD WITHOUT CONTRAST  Technique:  Multiplanar, multiecho pulse sequences of the  brain and surrounding structures were obtained according to standard protocol without intravenous contrast.  Comparison: 07/19/2011 CT.  Findings: Suggestion of tiny acute infarct right posterior pons possibly at the third cranial nerve level contributing to the  patient's diplopia.  No other infarct noted.  No intracranial hemorrhage.  Global atrophy without hydrocephalus.  No intracranial mass lesion detected on this unenhanced exam.  Major intracranial vascular structures are patent.  Paranasal sinus mucosal thickening most notable inferior aspect of the maxillary sinuses.  IMPRESSION: Suggestion of tiny acute infarct right posterior pons possibly at the third cranial nerve level contributing to the patient's diplopia.  Original Report Authenticated By: Fuller Canada, M.D.    Medications: I have reviewed the patient's current medications.  Assessment/Plan: 78 yo male with h/o hyperlipidemia and essential benign tremor who presented with acute onset diplopia associated with dizziness and unsteadiness. Acute hemorrhage or intracranial lesion was ruled out by head CT. No evidence of significant intracranial stenosis on CTA. Was out of the TPA window. MRI showed tiny acute infarct right posterior pons possibly at the 3rd cranial nerve which could explain his symptoms. Diplopia and associated dizziness has resolved.  1. Stroke workup:  - TSH, A1C pending - PT/OT today - speech eval normal  - 2D echo pending  - continue plavix 75mg  daily  2. Essential tremor:  - continue propranolol 20mg  tid  3. Hyperlipidemia:  - continue simvastatin 20mg  daily since LDL is well controled at 61 4. Fen/Gi:  - carb modified diet  - saline locked since no evidence of dehydration  5. PPx:  - lovenox 40mg  daily  6. Code status:  - DNR/DNI 7. Dispo: Home today pending echo results   LOS: 1 day   Derrick [name] 07/19/2011, 1:06 PM

## 2011-07-20 ENCOUNTER — Telehealth: Payer: Self-pay

## 2011-07-20 NOTE — Telephone Encounter (Signed)
Advised pt to take Plavix

## 2011-07-20 NOTE — Telephone Encounter (Signed)
He should take the Plavix

## 2011-07-20 NOTE — Telephone Encounter (Signed)
Dr hopper, pt called states had stroke on Monday seen at [facility] - put on plavix sent home Wants your advice if ok to stay on plavix? Please call will be home all afternoon.  Best: [phone] Bf and gaye foster

## 2011-07-21 NOTE — Discharge Summary (Signed)
Physician Discharge Summary   Patient ID: Derrick [name] [id number] 78 y.o. 15-Nov-1922  Admit date: 07/18/2011  Discharge date and time: 07/19/2011  6:11 PM   Admitting Physician: Carney Living, MD   Discharge Physician:  Carney Living, MD   Admission Diagnoses:  Diplopia [368.2] Vertigo [780.4] Hyperlipidemia Benign Essential Tremor   Discharge Diagnoses:  Diplopia Hyperlipidemia Benign Essential Tremor Admission Condition: fair  Discharged Condition: good  Indication for Admission: Stroke workup  Hospital Course:  78 year old male with history of hyperlipidemia and benign essential tremor who presented to the ED with sudden onset of diplopia associated with dizziness and lightheadedness who was admitted for stroke workup.  1. infarct in right posterior pons: In the ED a CT head was done which did not show any acute findings. CTA head and neck was done which did not show any intracranial artery stenosis or carotid and vertebral artery stenosis. Since patient was outside of the TPA window he did not receive thrombolytics. Physical exam on admission showed normal pupillary reflex with left eye down and out not always fully conjugating with the right eye movements. The rest of the cranial nerves were intact, strength in upper and lower extremities was normal as well as sensation to light touch in both upper and lower extremities bilaterally. Heel-to-shin was normal bilaterally and finger to nose showed an action tremor bilaterally consistent with his benign essential tremor. Brain MRI found tiny acute infarct in the right posterior pons possibly at the third cranial nerve which could contributes to the patient's symptoms. A 2-D echo of the heart was done which showed normal EF, mild LVH, mild to moderate regurgitation aortic valve and moderate regurgitation of the tricuspid. For risk stratification a lipid panel  was done which showed LDL 61 and total cholesterol of 132,   TSH was normal at 2.1 and a when seen was 6.3. On the day of discharge patient's vision was nearly back to normal. Only had very mild double vision with extreme rightward gaze. Since patient is allergic to aspirin he was discharged home with Plavix 75 mg daily. 2. benign essential tremor: The patient was kept on home dose propranolol 20 mg 3 times daily. Tremor appeared stable. 3. hyperlipidemia: Patient was kept on home dose of simvastatin 20 mg daily. LDL was found to be at goal at 61.  Consults:  1. physical therapy and occupational therapy 2. speech therapy  Significant Diagnostic Studies:  Head CT: 07/18/11 IMPRESSION:  Atrophy without acute abnormality.  MRI Brain without contrast: 07/18/11: Findings: Suggestion of tiny acute infarct right posterior pons  possibly at the third cranial nerve level contributing to the  patient's diplopia. No other infarct noted.  No intracranial hemorrhage.  Global atrophy without hydrocephalus.  No intracranial mass lesion detected on this unenhanced exam.  Major intracranial vascular structures are patent.  Paranasal sinus mucosal thickening most notable inferior aspect of  the maxillary sinuses.  IMPRESSION:  Suggestion of tiny acute infarct right posterior pons possibly at  the third cranial nerve level contributing to the patient's  diplopia.   CTA NECK 07/18/11 Findings: Extensive cervical disc degeneration and spondylosis  throughout the cervical spine. Cervical facet degeneration is also  present. No acute bony abnormality. Negative for mass or  adenopathy in the neck.  Right carotid: Right common carotid artery is widely patent. Mild  atherosclerotic calcification in the proximal right internal  carotid artery without significant stenosis. Atherosclerotic  calcification in the cavernous carotid without stenosis.  Left carotid: Left  common carotid artery is widely patent.  Carotid bifurcation is widely patent with a very small    calcification present in the proximal left internal carotid artery.  Mild atherosclerotic calcification in the cavernous carotid.  Vertebral arteries: Both vertebral arteries are patent to the  basilar. Right vertebral artery is slightly larger than the right.  Hypoplastic basilar due to fetal origin of the posterior cerebral  arteries bilaterally. PICA, and superior cerebellar arteries are  patent bilaterally. Hypoplastic distal basilar artery due to fetal  origin of the posterior cerebral arteries.  Review of the MIP images confirms the above findings.  IMPRESSION:  No significant carotid or vertebral artery stenosis.  CTA HEAD  Findings: Generalized atrophy. No intracranial hemorrhage. No  acute infarct.  Hypoplastic basilar due to fetal origin of the posterior cerebral  arteries bilaterally. Basilar ends in the superior cerebellar  arteries bilaterally. PICA and superior cerebellar arteries are  patent bilaterally. Both posterior cerebral arteries are patent.  Atherosclerotic disease in the cavernous carotid bilaterally which  is mild. No significant carotid stenosis. Anterior and middle  cerebral arteries are patent bilaterally without stenosis.  Negative for cerebral aneurysm.  CBC    Component Value Date/Time   WBC 8.7 07/19/2011 0615   RBC 4.11* 07/19/2011 0615   HGB 13.5 07/19/2011 0615   HCT 40.1 07/19/2011 0615   PLT 190 07/19/2011 0615   MCV 97.6 07/19/2011 0615   MCH 32.8 07/19/2011 0615   MCHC 33.7 07/19/2011 0615   RDW 12.6 07/19/2011 0615   LYMPHSABS 3.2 07/19/2011 0615   MONOABS 1.2* 07/19/2011 0615   EOSABS 0.2 07/19/2011 0615   BASOSABS 0.0 07/19/2011 0615    CMP     Component Value Date/Time   NA 139 07/19/2011 0615   K 4.2 07/19/2011 0615   CL 103 07/19/2011 0615   CO2 28 07/19/2011 0615   GLUCOSE 96 07/19/2011 0615   BUN 15 07/19/2011 0615   CREATININE 1.06 07/19/2011 0615   CALCIUM 9.6 07/19/2011 0615   GFRNONAA 61* 07/19/2011 0615   GFRAA 71* 07/19/2011 0615     Discharge Exam: Vital signs in last 24 hours:  Temp: [97.4 F (36.3 C)-98.4 F (36.9 C)] 97.5 F (36.4 C) (02/19 0900)  Pulse Rate: [54-70] 61 (02/19 0900)  Resp: [16-20] 18 (02/19 0900)  BP: (98-155)/(54-89) 128/74 mmHg (02/19 0900)  SpO2: [94 %-97 %] 95 % (02/19 0900)  Weight: [145 lb (65.772 kg)-156 lb 8.4 oz (71 kg)] 156 lb 8.4 oz (71 kg) (02/18 2200)   General appearance: alert, cooperative, appears stated age and no distress  Resp: clear to auscultation bilaterally  Cardio: S1S2, 2/6 systolic murmur best heard at left sternal border, mildly bradycardic  GI: soft, non-tender; bowel sounds normal; no masses, no organomegaly  Neurologic: a+ox3, CN2-12 grossly intact: left eye movement normal compared to yesterday. 5/5 strength in lower and upper extremities bilaterally, heel to shin intact, tremor with finger to nose  Disposition: 01-Home or Self Care  Patient Instructions:  Medication List  As of 07/21/2011  4:37 PM   TAKE these medications         clopidogrel 75 MG tablet   Commonly known as: PLAVIX   Take 1 tablet (75 mg total) by mouth daily with breakfast.      OVER THE COUNTER MEDICATION   Place 1 drop into both eyes daily. Over the counter eye drop      propranolol 20 MG tablet   Commonly known as: INDERAL   Take 20  mg by mouth daily.      simvastatin 20 MG tablet   Commonly known as: ZOCOR   Take 20 mg by mouth every evening.           Activity: activity as tolerated Diet: regular diet Wound Care: none needed  Follow-up with PCP at [facility]  for next week Follow up items: - balance with walking and standing - resolution of diplopia  Signed: Marena Chancy 07/21/2011 4:37 PM

## 2011-12-05 ENCOUNTER — Other Ambulatory Visit (HOSPITAL_COMMUNITY): Payer: Self-pay | Admitting: Family Medicine

## 2011-12-05 ENCOUNTER — Telehealth: Payer: Self-pay

## 2011-12-05 NOTE — Telephone Encounter (Signed)
Patient states that [facility] sent request to refill Plavix. Pt would like a call when that has been completed.  [phone]

## 2011-12-06 MED ORDER — CLOPIDOGREL BISULFATE 75 MG PO TABS: 75.0000 mg | ORAL_TABLET | Freq: Every day | ORAL | Status: DC

## 2011-12-06 NOTE — Telephone Encounter (Signed)
Pt wife notified for note

## 2011-12-06 NOTE — Telephone Encounter (Signed)
Chart is at the nurses station in the phone message pile with a RX refill request on top  [id number]

## 2011-12-06 NOTE — Telephone Encounter (Signed)
Medication sent to pharmacy. He should plan for an office visit in the next 1-2 months since he has not been seen since Sept 2012.

## 2012-01-02 ENCOUNTER — Ambulatory Visit (INDEPENDENT_AMBULATORY_CARE_PROVIDER_SITE_OTHER): Payer: Medicare Other | Admitting: Family Medicine

## 2012-01-02 VITALS — BP 118/74 | HR 72 | Temp 97.8°F | Resp 16 | Ht 68.0 in | Wt 156.0 lb

## 2012-01-02 DIAGNOSIS — H9209 Otalgia, unspecified ear: Secondary | ICD-10-CM

## 2012-01-02 DIAGNOSIS — H60399 Other infective otitis externa, unspecified ear: Secondary | ICD-10-CM

## 2012-01-02 DIAGNOSIS — H6091 Unspecified otitis externa, right ear: Secondary | ICD-10-CM

## 2012-01-02 MED ORDER — NEOMYCIN-POLYMYXIN-HC 3.5-10000-1 OT SOLN: 3.0000 [drp] | Freq: Four times a day (QID) | OTIC | Status: AC

## 2012-01-02 NOTE — Progress Notes (Signed)
This is an 78 year old gentleman who has had a recent cold with cough and congestion. Over the last several days he's had diminished hearing in the right ear with some discomfort. When he pulls on the auricle, he has resumption of normal hearing.  Objective:  No acute distress Examination right ear reveals normal auricle with no swelling or adenopathy. The canal reveals white and yellow cerumen impaction.  After lavage, the canal was noted to be red and thinly coated with white exudate.  The TM is normal

## 2012-02-01 ENCOUNTER — Ambulatory Visit (INDEPENDENT_AMBULATORY_CARE_PROVIDER_SITE_OTHER): Payer: Medicare Other | Admitting: Family Medicine

## 2012-02-01 VITALS — BP 119/71 | HR 73 | Temp 98.5°F | Resp 18 | Ht 69.0 in | Wt 154.8 lb

## 2012-02-01 DIAGNOSIS — Z8673 Personal history of transient ischemic attack (TIA), and cerebral infarction without residual deficits: Secondary | ICD-10-CM | POA: Insufficient documentation

## 2012-02-01 DIAGNOSIS — L989 Disorder of the skin and subcutaneous tissue, unspecified: Secondary | ICD-10-CM

## 2012-02-01 DIAGNOSIS — Z0000 Encounter for general adult medical examination without abnormal findings: Secondary | ICD-10-CM | POA: Insufficient documentation

## 2012-02-01 DIAGNOSIS — N400 Enlarged prostate without lower urinary tract symptoms: Secondary | ICD-10-CM | POA: Insufficient documentation

## 2012-02-01 DIAGNOSIS — E785 Hyperlipidemia, unspecified: Secondary | ICD-10-CM | POA: Insufficient documentation

## 2012-02-01 DIAGNOSIS — G250 Essential tremor: Secondary | ICD-10-CM

## 2012-02-01 LAB — POCT CBC
Hemoglobin: 13.8 g/dL — AB (ref 14.1–18.1)
Lymph, poc: 2.6 (ref 0.6–3.4)
MCH, POC: 31.6 pg — AB (ref 27–31.2)
MCHC: 31.2 g/dL — AB (ref 31.8–35.4)
MCV: 101.2 fL — AB (ref 80–97)
MID (cbc): 0.7 (ref 0–0.9)
POC MID %: 6.9 %M (ref 0–12)
RBC: 4.37 M/uL — AB (ref 4.69–6.13)
WBC: 10.3 10*3/uL — AB (ref 4.6–10.2)

## 2012-02-01 LAB — POCT UA - MICROSCOPIC ONLY
Casts, Ur, LPF, POC: NEGATIVE
Crystals, Ur, HPF, POC: NEGATIVE

## 2012-02-01 LAB — POCT URINALYSIS DIPSTICK
Ketones, UA: NEGATIVE
Protein, UA: NEGATIVE
Spec Grav, UA: 1.02
Urobilinogen, UA: 0.2
pH, UA: 6

## 2012-02-01 LAB — COMPREHENSIVE METABOLIC PANEL
Alkaline Phosphatase: 44 U/L (ref 39–117)
BUN: 16 mg/dL (ref 6–23)
CO2: 29 mEq/L (ref 19–32)
Creat: 1.05 mg/dL (ref 0.50–1.35)
Glucose, Bld: 114 mg/dL — ABNORMAL HIGH (ref 70–99)
Total Bilirubin: 0.9 mg/dL (ref 0.3–1.2)
Total Protein: 7.7 g/dL (ref 6.0–8.3)

## 2012-02-01 LAB — LIPID PANEL
Cholesterol: 161 mg/dL (ref 0–200)
Total CHOL/HDL Ratio: 2.7 Ratio
Triglycerides: 81 mg/dL (ref <150)

## 2012-02-01 LAB — TSH: TSH: 2.127 u[IU]/mL (ref 0.350–4.500)

## 2012-02-01 MED ORDER — PROPRANOLOL HCL 20 MG PO TABS: 20.0000 mg | ORAL_TABLET | Freq: Every day | ORAL | Status: DC

## 2012-02-01 MED ORDER — SIMVASTATIN 20 MG PO TABS: 20.0000 mg | ORAL_TABLET | Freq: Every evening | ORAL | Status: DC

## 2012-02-01 MED ORDER — CLOPIDOGREL BISULFATE 75 MG PO TABS: 75.0000 mg | ORAL_TABLET | Freq: Every day | ORAL | Status: DC

## 2012-02-01 NOTE — Progress Notes (Signed)
Subjective: Patient is here for multiple things. He is doing fairly well today, but since his last visit he had a TIA. He went to the emergency room and workup revealed a small basilar clot. He was placed on Plavix and did well. He has not had any further problems from that. He has been able to be quite active still. He volunteers at [employer]. He is going to [place] to give my paper and a couple of weeks at a conference he attends over there in [place].  Past medical history generally has been healthy. He has a history of some sciatica which is not bothering him now. He has had the TIA as noted above. He has a history of hyperlipidemia and an essential tremor  Surgical history: He has had a vasectomy and tonsillectomy  Medication allergies: Aspirin which causes him to break out  Current medications propranolol simvastatin and generic Plavix  Family history.: All his relatives are deceased.  Social history: He is married as he has been for 37 years. This was his second wife. He has not smoked. He demonstrates modestly. He does not use any drugs. 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 He walks fracture size, 30 minutes daily.  Review of systems: Constitutional: Unremarkable HEENT: Unremy:arkable Eyes : poor vision Respiratory: unremarkable Cardiovascular: Unremarkable GI: Unremarkable GU: Unremarkable. He does not get up at night to urinate Muscle skeletal: Unremarkable Dermatologic: He has a lesion on his left temple. It's been there for a while. Also has a place on his left elbow. Neurologic: Essential tremor. The TIA is not bothering him anymore. Hematologic: Unremarkable Psychiatric: Unremarkable Endocrine: Unremarkable  Physical examination Alert and oriented. Wears hearing aids. TMs normal. Eyes PERRLA. Throat clear. Neck supple without nodes or thyromegaly. No carotid bruits. Chest clear to  auscultation. Heart regular without murmurs gallops or arrhythmias. Abdomen soft without mass or tenderness. Normal male external genitalia testes descended. Digital rectal exam his prostate gland  enlarged. Both lower lobes seem bigger and firm. Extremities unremarkable. Skin he has a small necrotic skin today above his left elbow which I think would just fall off. There is a crusted lesion on the left temple that measures 1.5 cm in diameter. AK versus early basal cell or squamous cell skin cancer.  Assessment: Recent TIA Hyperlipidemia Essential tremor BPH Suspicious skin lesion left temple Hyperlipidemia  Plan: Check labs Stimulation  Procedure note: Lesion was numbed with 1% lidocaine with epinephrine 3 mm punch was used to obtain the specimen. It was sent for pathologic analysis. The wound was cauterized with silver nitrate.

## 2012-02-01 NOTE — Patient Instructions (Addendum)
Have a safe trip to [place].  Continue same medications.  I will try to get back to you with regard to the biopsy. If you do not hear from me please call wen you return.

## 2012-02-03 ENCOUNTER — Telehealth: Payer: Self-pay | Admitting: Radiology

## 2012-02-03 NOTE — Telephone Encounter (Signed)
Pathology called back to get site of biopsy that was done, it was left temple/face.

## 2012-02-06 ENCOUNTER — Encounter: Payer: Self-pay | Admitting: *Deleted

## 2012-02-28 ENCOUNTER — Ambulatory Visit (INDEPENDENT_AMBULATORY_CARE_PROVIDER_SITE_OTHER): Payer: Medicare Other | Admitting: Family Medicine

## 2012-02-28 VITALS — BP 145/75 | HR 66 | Temp 98.8°F | Resp 16 | Ht 69.0 in | Wt 155.0 lb

## 2012-02-28 DIAGNOSIS — L821 Other seborrheic keratosis: Secondary | ICD-10-CM

## 2012-02-28 DIAGNOSIS — L570 Actinic keratosis: Secondary | ICD-10-CM

## 2012-02-28 NOTE — Progress Notes (Signed)
Subjective: Patient is here for followup with regard to the place a biopsy on his face. The punch biopsy showed that this was an actinic keratosis. The pathology is not back in the computer yet, and must be in the stack to be scanned. He madehis troip to [place] and did well.  Objective: Actinic keratosis has healed up from the biopsy. He has some large seborrheic keratoses on his left upper chest that he would like taken off. All 3 lesions were frozen with cryosurgery. He tolerated the procedures well. Freeze refreeze x2. He is to return in one week if they're giving him troubles. I will refreeze them when I get back from my trip if necessary.  Assessment: Actinic keratosis left temple Seborrheic keratosis chest wall  Plan: Cryosurgery as noted above. He will return as needed.

## 2012-04-05 ENCOUNTER — Ambulatory Visit (INDEPENDENT_AMBULATORY_CARE_PROVIDER_SITE_OTHER): Payer: Medicare Other | Admitting: Family Medicine

## 2012-04-05 ENCOUNTER — Ambulatory Visit: Payer: Medicare Other

## 2012-04-05 VITALS — BP 126/80 | HR 85 | Temp 100.4°F | Resp 17 | Ht 69.5 in | Wt 155.0 lb

## 2012-04-05 DIAGNOSIS — R509 Fever, unspecified: Secondary | ICD-10-CM

## 2012-04-05 DIAGNOSIS — J329 Chronic sinusitis, unspecified: Secondary | ICD-10-CM

## 2012-04-05 DIAGNOSIS — J40 Bronchitis, not specified as acute or chronic: Secondary | ICD-10-CM

## 2012-04-05 DIAGNOSIS — R059 Cough, unspecified: Secondary | ICD-10-CM

## 2012-04-05 DIAGNOSIS — R05 Cough: Secondary | ICD-10-CM

## 2012-04-05 LAB — POCT CBC
Granulocyte percent: 72 %G (ref 37–80)
HCT, POC: 42.3 % — AB (ref 43.5–53.7)
MCV: 102.5 fL — AB (ref 80–97)
MID (cbc): 1.2 — AB (ref 0–0.9)
POC Granulocyte: 10.9 — AB (ref 2–6.9)
POC LYMPH PERCENT: 20.3 %L (ref 10–50)
Platelet Count, POC: 216 10*3/uL (ref 142–424)
RBC: 4.13 M/uL — AB (ref 4.69–6.13)
RDW, POC: 13.4 %

## 2012-04-05 MED ORDER — HYDROCODONE-HOMATROPINE 5-1.5 MG/5ML PO SYRP: 5.0000 mL | ORAL_SOLUTION | Freq: Three times a day (TID) | ORAL | Status: DC | PRN

## 2012-04-05 MED ORDER — CEFTRIAXONE SODIUM 1 G IJ SOLR
1.0000 g | Freq: Once | INTRAMUSCULAR | Status: AC
  Administered 2012-04-05: 1 g via INTRAMUSCULAR

## 2012-04-05 MED ORDER — CEFDINIR 300 MG PO CAPS: 300.0000 mg | ORAL_CAPSULE | Freq: Two times a day (BID) | ORAL | Status: DC

## 2012-04-05 NOTE — Progress Notes (Signed)
Subjective: Has been ill for about 3 weeks. It started as a cold and sinus type problem. He is persisted with being congested. He took OTC Mucinex he has been coughing a lot the last couple of nights. He has never had pneumonia. He is not blowing much stuff out of coughing up stuff up now. He did have a flu shot.  Objective: Wears a hearing aid. Nose clear. Throat clear. Neck supple without significant nodes. Chest has somewhat diminished air exchange bilaterally but no loss or wheezes were noted. He is very warm to touch.  Repeat fever check showed temperature 101.6.  Assessment: Fever Cough A congestion  Plan: CBC Chest x-ray Tylenol 2 tablets now.  [facility] reading (PRIMARY) by  Dr. Alwyn Ren No acute findings.  Mild pulmonary scarring consistent with age.  Results for orders placed in visit on 04/05/12  POCT CBC      Component Value Range   WBC 15.2 (*) 4.6 - 10.2 K/uL   Lymph, poc 3.1  0.6 - 3.4   POC LYMPH PERCENT 20.3  10 - 50 %L   MID (cbc) 1.2 (*) 0 - 0.9   POC MID % 7.7  0 - 12 %M   POC Granulocyte 10.9 (*) 2 - 6.9   Granulocyte percent 72.0  37 - 80 %G   RBC 4.13 (*) 4.69 - 6.13 M/uL   Hemoglobin 13.2 (*) 14.1 - 18.1 g/dL   HCT, POC 11.9 (*) 14.7 - 53.7 %   MCV 102.5 (*) 80 - 97 fL   MCH, POC 32.0 (*) 27 - 31.2 pg   MCHC 31.2 (*) 31.8 - 35.4 g/dL   RDW, POC 82.9     Platelet Count, POC 216  142 - 424 K/uL   MPV 8.5  0 - 99.8 fL  '  Assessment: Rhonchi this Sinusitis  We'll give Rocephin 1 g IM, and return in 2 days for recheck.

## 2012-04-05 NOTE — Patient Instructions (Signed)
Drink lots of fluids  Return Saturday after 10 AM, sooner if needed.

## 2012-04-07 ENCOUNTER — Encounter: Payer: Self-pay | Admitting: Family Medicine

## 2012-04-07 ENCOUNTER — Ambulatory Visit (INDEPENDENT_AMBULATORY_CARE_PROVIDER_SITE_OTHER): Payer: Medicare Other | Admitting: Family Medicine

## 2012-04-07 VITALS — BP 121/66 | HR 74 | Temp 101.2°F | Resp 16 | Ht 69.5 in | Wt 156.8 lb

## 2012-04-07 DIAGNOSIS — J40 Bronchitis, not specified as acute or chronic: Secondary | ICD-10-CM

## 2012-04-07 DIAGNOSIS — R509 Fever, unspecified: Secondary | ICD-10-CM

## 2012-04-07 LAB — POCT CBC
HCT, POC: 43.3 % — AB (ref 43.5–53.7)
Hemoglobin: 13.4 g/dL — AB (ref 14.1–18.1)
Lymph, poc: 2.9 (ref 0.6–3.4)
MCH, POC: 31.5 pg — AB (ref 27–31.2)
MCHC: 30.9 g/dL — AB (ref 31.8–35.4)
POC LYMPH PERCENT: 20.4 %L (ref 10–50)
RDW, POC: 13.2 %
WBC: 14.4 10*3/uL — AB (ref 4.6–10.2)

## 2012-04-07 MED ORDER — ACETAMINOPHEN 325 MG PO TABS
1000.0000 mg | ORAL_TABLET | Freq: Once | ORAL | Status: AC
  Administered 2012-04-07: 975 mg via ORAL

## 2012-04-07 MED ORDER — AZITHROMYCIN 250 MG PO TABS: ORAL_TABLET | ORAL | Status: DC

## 2012-04-07 NOTE — Patient Instructions (Addendum)
Add zithromax to your regimen.  Return Monday for recheck if not much better.

## 2012-04-07 NOTE — Progress Notes (Signed)
  Subjective: 78 year old man who has a history tract infection. He was in here 2 days ago with the coughing and a significant fever. His white count was 15,200. He was given a shot of ceftriaxone and placed on Omnicef twice a day. He is asked to come back in here today to be rechecked. He thinks he is feeling minimally better, though he still has a fever today. He continues to cough some. His essential tremors been worse or has been ill. He needs to remain alert and oriented. He did not have any night sweats last night.  Objective: Pleasant elderly gentleman in no major distress right now though he is weak. His throat is clear. He wears bilateral hearing aids. Chest is clear to auscultation. Heart regular without murmurs.  Assessment:  Bronchitis and febrile respiratory illness.  Plan: Check CBC  Results for orders placed in visit on 04/07/12  POCT CBC      Component Value Range   WBC 14.4 (*) 4.6 - 10.2 K/uL   Lymph, poc 2.9  0.6 - 3.4   POC LYMPH PERCENT 20.4  10 - 50 %L   MID (cbc) 1.3 (*) 0 - 0.9   POC MID % 8.8  0 - 12 %M   POC Granulocyte 10.2 (*) 2 - 6.9   Granulocyte percent 70.8  37 - 80 %G   RBC 4.26 (*) 4.69 - 6.13 M/uL   Hemoglobin 13.4 (*) 14.1 - 18.1 g/dL   HCT, POC 16.1 (*) 09.6 - 53.7 %   MCV 101.7 (*) 80 - 97 fL   MCH, POC 31.5 (*) 27 - 31.2 pg   MCHC 30.9 (*) 31.8 - 35.4 g/dL   RDW, POC 04.5     Platelet Count, POC 249  142 - 424 K/uL   MPV 8.3  0 - 99.8 fL   Add zithromax RTC 2 days if not much better, sooner if worse.

## 2012-04-09 ENCOUNTER — Ambulatory Visit (INDEPENDENT_AMBULATORY_CARE_PROVIDER_SITE_OTHER): Payer: Medicare Other | Admitting: Family Medicine

## 2012-04-09 ENCOUNTER — Ambulatory Visit: Payer: Medicare Other

## 2012-04-09 VITALS — BP 102/78 | HR 68 | Temp 98.7°F | Resp 18 | Ht 69.5 in | Wt 156.0 lb

## 2012-04-09 DIAGNOSIS — J189 Pneumonia, unspecified organism: Secondary | ICD-10-CM

## 2012-04-09 DIAGNOSIS — R05 Cough: Secondary | ICD-10-CM

## 2012-04-09 DIAGNOSIS — R059 Cough, unspecified: Secondary | ICD-10-CM

## 2012-04-09 LAB — POCT CBC
Granulocyte percent: 61.2 %G (ref 37–80)
Hemoglobin: 13.2 g/dL — AB (ref 14.1–18.1)
MID (cbc): 0.8 (ref 0–0.9)
MPV: 9.1 fL (ref 0–99.8)
POC Granulocyte: 5.3 (ref 2–6.9)
POC MID %: 9.3 %M (ref 0–12)
Platelet Count, POC: 276 10*3/uL (ref 142–424)
RBC: 4.08 M/uL — AB (ref 4.69–6.13)

## 2012-04-09 NOTE — Progress Notes (Signed)
Subjective: Patient continues to feel run down. He says he may be a little bit better but not much. He was afebrile at home. He got a new thermometer he is only coughing up a little bit of phlegm. He still has chills. He is taking his medications as ordered.  Objective: Throat clear. Neck supple without nodes or thyromegaly. Chest is clear to auscultation.  [facility] reading (PRIMARY) by  Dr. Alwyn Ren Mild increase congestion rll.  Results for orders placed in visit on 04/09/12  POCT CBC      Component Value Range   WBC 8.7  4.6 - 10.2 K/uL   Lymph, poc 2.6  0.6 - 3.4   POC LYMPH PERCENT 29.5  10 - 50 %L   MID (cbc) 0.8  0 - 0.9   POC MID % 9.3  0 - 12 %M   POC Granulocyte 5.3  2 - 6.9   Granulocyte percent 61.2  37 - 80 %G   RBC 4.08 (*) 4.69 - 6.13 M/uL   Hemoglobin 13.2 (*) 14.1 - 18.1 g/dL   HCT, POC 16.1 (*) 09.6 - 53.7 %   MCV 100.9 (*) 80 - 97 fL   MCH, POC 32.4 (*) 27 - 31.2 pg   MCHC 32.0  31.8 - 35.4 g/dL   RDW, POC 04.5     Platelet Count, POC 276  142 - 424 K/uL   MPV 9.1  0 - 99.8 fL   . Assessment: Consistent with mild pneumonia, improving  Plan continue same medications return Monday for recheck sooner if problems thank you

## 2012-09-18 ENCOUNTER — Ambulatory Visit (INDEPENDENT_AMBULATORY_CARE_PROVIDER_SITE_OTHER): Payer: Medicare Other | Admitting: Internal Medicine

## 2012-09-18 VITALS — BP 120/65 | HR 87 | Temp 98.3°F | Resp 18 | Wt 151.0 lb

## 2012-09-18 DIAGNOSIS — H6120 Impacted cerumen, unspecified ear: Secondary | ICD-10-CM

## 2012-09-18 DIAGNOSIS — H9190 Unspecified hearing loss, unspecified ear: Secondary | ICD-10-CM

## 2012-09-18 DIAGNOSIS — H9193 Unspecified hearing loss, bilateral: Secondary | ICD-10-CM

## 2012-09-18 NOTE — Progress Notes (Signed)
  Subjective:    Patient ID: Derrick [name], male    DOB: 03-Nov-1922, 79 y.o.   MRN: [id number]  HPI Getting new hearing aids. Needs wax cleaned out   Review of Systems     Objective:   Physical Exam  Vitals reviewed. Constitutional: He is oriented to person, place, and time. He appears well-developed and well-nourished.  HENT:  Right Ear: External ear normal.  Left Ear: External ear normal.  Neck: Neck supple.  Pulmonary/Chest: Effort normal.  Neurological: He is alert and oriented to person, place, and time.  Psychiatric: He has a normal mood and affect.          Assessment & Plan:  Irrigate ears till clear

## 2012-09-18 NOTE — Patient Instructions (Signed)
Hearing Loss  A hearing loss is sometimes called deafness. Hearing loss may be partial or total.  CAUSES  Hearing loss may be caused by:   Wax in the ear canal.   Infection of the ear canal.   Infection of the middle ear.   Trauma to the ear or surrounding area.   Fluid in the middle ear.   A hole in the eardrum (perforated eardrum).   Exposure to loud sounds or music.   Problems with the hearing nerve.   Certain medications.  Hearing loss without wax, infection, or a history of injury may mean that the nerve is involved. Hearing loss with severe dizziness, nausea and vomiting or ringing in the ear may suggest a hearing nerve irritation or problems in the middle or inner ear. If hearing loss is untreated, there is a greater likelihood for residual or permanent hearing loss.  DIAGNOSIS  A hearing test (audiometry) assesses hearing loss. The audiometry test needs to be performed by a hearing specialist (audiologist).  TREATMENT  Treatment for recent onset of hearing loss may include:   Ear wax removal.   Medications that kill germs (antibiotics).   Cortisone medications.   Prompt follow up with the appropriate specialist.  Return of hearing depends on the cause of your hearing loss, so proper medical follow-up is important. Some hearing loss may not be reversible, and a caregiver should discuss care and treatment options with you.  SEEK MEDICAL CARE IF:    You have a severe headache, dizziness, or changes in vision.   You have new or increased weakness.   You develop repeated vomiting or other serious medical problems.   You have a fever.  Document Released: 05/16/2005 Document Revised: 08/08/2011 Document Reviewed: 09/10/2009  Exit[employer] Patient Information 2013 [employer].

## 2012-11-14 ENCOUNTER — Telehealth: Payer: Self-pay

## 2012-11-14 DIAGNOSIS — H9193 Unspecified hearing loss, bilateral: Secondary | ICD-10-CM

## 2012-11-14 NOTE — Telephone Encounter (Signed)
Patient would like a referral to go see dr Maren Reamer for a very involved hearing test please call patient at [phone] if questions

## 2012-11-14 NOTE — Telephone Encounter (Signed)
Referral made, this is needed for medicare to cover the exam

## 2012-11-14 NOTE — Telephone Encounter (Signed)
Patient wanted to have Dr. Alwyn Ren call him back to ask about his referral (i'm confused because patients referral is in the [facility], and he says he has an appointment with the audiologist, he just needed Dr. Caryl Never permission?)   Please advise  Best [phone]

## 2013-01-24 ENCOUNTER — Other Ambulatory Visit: Payer: Self-pay | Admitting: Family Medicine

## 2013-01-27 ENCOUNTER — Other Ambulatory Visit: Payer: Self-pay | Admitting: Family Medicine

## 2013-02-08 ENCOUNTER — Ambulatory Visit: Payer: Medicare Other

## 2013-02-08 ENCOUNTER — Ambulatory Visit (INDEPENDENT_AMBULATORY_CARE_PROVIDER_SITE_OTHER): Payer: Medicare Other | Admitting: Family Medicine

## 2013-02-08 VITALS — BP 122/68 | HR 71 | Temp 98.5°F | Resp 17 | Ht 68.5 in | Wt 145.0 lb

## 2013-02-08 DIAGNOSIS — R590 Localized enlarged lymph nodes: Secondary | ICD-10-CM

## 2013-02-08 DIAGNOSIS — Z0000 Encounter for general adult medical examination without abnormal findings: Secondary | ICD-10-CM

## 2013-02-08 DIAGNOSIS — Z23 Encounter for immunization: Secondary | ICD-10-CM

## 2013-02-08 DIAGNOSIS — E785 Hyperlipidemia, unspecified: Secondary | ICD-10-CM

## 2013-02-08 DIAGNOSIS — Z139 Encounter for screening, unspecified: Secondary | ICD-10-CM

## 2013-02-08 DIAGNOSIS — R599 Enlarged lymph nodes, unspecified: Secondary | ICD-10-CM

## 2013-02-08 DIAGNOSIS — R9389 Abnormal findings on diagnostic imaging of other specified body structures: Secondary | ICD-10-CM

## 2013-02-08 LAB — POCT CBC
Granulocyte percent: 62.1 %G (ref 37–80)
HCT, POC: 46 % (ref 43.5–53.7)
Lymph, poc: 2.7 (ref 0.6–3.4)
MCH, POC: 32.4 pg — AB (ref 27–31.2)
MCHC: 31.3 g/dL — AB (ref 31.8–35.4)
MCV: 103.5 fL — AB (ref 80–97)
MID (cbc): 0.8 (ref 0–0.9)
POC LYMPH PERCENT: 29.4 %L (ref 10–50)
Platelet Count, POC: 235 10*3/uL (ref 142–424)
RDW, POC: 14 %
WBC: 9.2 10*3/uL (ref 4.6–10.2)

## 2013-02-08 LAB — COMPREHENSIVE METABOLIC PANEL
Alkaline Phosphatase: 45 U/L (ref 39–117)
BUN: 15 mg/dL (ref 6–23)
CO2: 31 mEq/L (ref 19–32)
Glucose, Bld: 111 mg/dL — ABNORMAL HIGH (ref 70–99)
Sodium: 138 mEq/L (ref 135–145)
Total Bilirubin: 0.9 mg/dL (ref 0.3–1.2)
Total Protein: 7.5 g/dL (ref 6.0–8.3)

## 2013-02-08 LAB — LIPID PANEL
LDL Cholesterol: 71 mg/dL (ref 0–99)
Triglycerides: 76 mg/dL (ref <150)
VLDL: 15 mg/dL (ref 0–40)

## 2013-02-08 NOTE — Patient Instructions (Addendum)
Continue current medications  We will get the thyroid scan scheduled and contact you in the very near future. Should you not hear from our office on that by to use tea call and speak to the referrals desk and see if that has been arranged.  Return in 2 weeks for me to recheck back gland

## 2013-02-08 NOTE — Progress Notes (Addendum)
Physical exam:  History: Patient is here for a regular physical examination for his annual Medicare exam. He has not had any acute medical complaints. He's been doing quite well. He continues to be very active. 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 He remains active.  Past medical history Generally has been healthy. He has a history of some sciatica which is not bothering him now. He has had the TIA. He has a history of hyperlipidemia and an essential tremor and says that propranolol does not help much.  Surgical history: He has had a vasectomy and tonsillectomy  Medication allergies: Aspirin which causes him to break out  Current medications propranolol simvastatin and generic Plavix  Family history.: All his relatives are deceased.  Social history: He is married as he has been for 37 years. This was his second wife. He has not smoked. He demonstrates modestly. He does not use any drugs. 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 He walks faithfully 30 minutes daily.  Review of systems:  Constitutional: Unremarkable  HEENT: Unremy:arkable  Eyes : poor vision  Respiratory: unremarkable  Cardiovascular: Unremarkable  GI: Unremarkable  GU: Unremarkable. He does not get up at night to urinate  Muscle skeletal: Unremarkable  Dermatologic: He has a lesion on his left temple. It's been there for a while. Also has a place on his left elbow.  Neurologic: Essential tremor. The TIA is not bothering him anymore.  Hematologic: Unremarkable  Psychiatric: Unremarkable  Endocrine: Unremarkable  Physical examination: Well-developed well-nourished man in no major distress. TMs normal. Eyes PERRLA. Throat clear. Neck supple. Has a visible node about 3.5 centimeters in diameter on the left lower neck just above the edge of the thyroid. He is chest is clear to auscultation. Heart regular without murmurs gallops or arrhythmias.  Ankles without mass or tenderness. Normal external genitalia without hernias. Digital rectal exam was normal. Extremities unremarkable. Skin unremarkable. Has a fine tremor of his hands.  Assessment: Annual physical examination for Medicare Left neck nodule, lymphadenopathy versus thyroid nodule versus other Tremor History of TIA History of hyperlipidemia  Plan: Screening for depression is negative Screening labs will be done Hemoccult was negative  Results for orders placed in visit on 02/08/13  POCT CBC      Result Value Range   WBC 9.2  4.6 - 10.2 K/uL   Lymph, poc 2.7  0.6 - 3.4   POC LYMPH PERCENT 29.4  10 - 50 %L   MID (cbc) 0.8  0 - 0.9   POC MID % 8.5  0 - 12 %M   POC Granulocyte 5.7  2 - 6.9   Granulocyte percent 62.1  37 - 80 %G   RBC 4.44 (*) 4.69 - 6.13 M/uL   Hemoglobin 14.4  14.1 - 18.1 g/dL   HCT, POC 16.1  09.6 - 53.7 %   MCV 103.5 (*) 80 - 97 fL   MCH, POC 32.4 (*) 27 - 31.2 pg   MCHC 31.3 (*) 31.8 - 35.4 g/dL   RDW, POC 04.5     Platelet Count, POC 235  142 - 424 K/uL   MPV 8.8  0 - 99.8 fL  IFOBT (OCCULT BLOOD)      Result Value Range   IFOBT Negative      ADDITIONAL PROGRESS NOTE: CERVICAL NODE Subjective: Patient was not aware of a mass in his neck. He denies any  pain or other symptoms related to it. No history of thyroid problems. No history of any chest problems. No malignancies.  Objective: The moderately firm node is about 3.5 cm in diameter in the left neck just above the level upper pole thyroid. I cannot tell that it is attached to the thyroid for certain. No other lymphadenopathy is noted elsewhere.  Assessment: Left cervical mass  Plan: Refer for ultrasound of the neck. Will probably need a needle biopsy and/or CT scan.  [facility] reading (PRIMARY) by  Dr. Alwyn Ren No acute changes.  Prominent bronchovasculary markings to the periphery of lungs c/w age.Marland Kitchen

## 2013-02-13 ENCOUNTER — Ambulatory Visit
Admission: RE | Admit: 2013-02-13 | Discharge: 2013-02-13 | Disposition: A | Discharge status: Home / Self Care | Payer: Medicare Other | Source: Ambulatory Visit | Attending: Family Medicine | Admitting: Family Medicine

## 2013-02-13 DIAGNOSIS — R590 Localized enlarged lymph nodes: Secondary | ICD-10-CM

## 2013-02-17 ENCOUNTER — Telehealth: Payer: Self-pay | Admitting: Family Medicine

## 2013-02-18 ENCOUNTER — Other Ambulatory Visit: Payer: Self-pay | Admitting: Radiology

## 2013-02-18 DIAGNOSIS — R220 Localized swelling, mass and lump, head: Secondary | ICD-10-CM

## 2013-02-22 ENCOUNTER — Telehealth: Payer: Self-pay | Admitting: Family Medicine

## 2013-02-22 ENCOUNTER — Ambulatory Visit
Admission: RE | Admit: 2013-02-22 | Discharge: 2013-02-22 | Disposition: A | Discharge status: Home / Self Care | Payer: Medicare Other | Source: Ambulatory Visit | Attending: Family Medicine | Admitting: Family Medicine

## 2013-02-22 ENCOUNTER — Other Ambulatory Visit: Payer: Self-pay | Admitting: Radiology

## 2013-02-22 DIAGNOSIS — R221 Localized swelling, mass and lump, neck: Secondary | ICD-10-CM

## 2013-02-22 DIAGNOSIS — R220 Localized swelling, mass and lump, head: Secondary | ICD-10-CM

## 2013-02-22 MED ORDER — IOHEXOL 300 MG/ML  SOLN
75.0000 mL | Freq: Once | INTRAMUSCULAR | Status: AC | PRN
  Administered 2013-02-22: 75 mL via INTRAVENOUS

## 2013-02-22 NOTE — Telephone Encounter (Signed)
Patient had his ultrasound of his neck, and was recommended to have a CT or a needle biopsy. CT scan was obtained, and is suspicious for a malignancy with the largest node, several small nodes, and some kind of a lesion on the back of his tongue. Patient was called and advised of the findings and he agrees to being scheduled for the needle biopsy and referral to an ENT doctor to evaluate his oropharynx.

## 2013-02-24 NOTE — Telephone Encounter (Signed)
Opened by accident

## 2013-02-26 ENCOUNTER — Telehealth: Payer: Self-pay

## 2013-02-26 NOTE — Telephone Encounter (Signed)
Amy work on making referral for a needle biopsy of the mass in his neck. We need to find out when that is scheduled for and let him know.

## 2013-02-26 NOTE — Telephone Encounter (Signed)
Patient states that he had some imaging tests done for a nodule near his thyroid and he has not heard anything back.  [phone]

## 2013-02-27 ENCOUNTER — Telehealth: Payer: Self-pay

## 2013-02-27 NOTE — Telephone Encounter (Signed)
   PT HAS AN APPT ON [id number] AT 150 WITH [facility] WITH DR [facility] interventional radiology has order for the biopsy of the neck. Lupita Leash will have them call today.

## 2013-02-27 NOTE — Telephone Encounter (Signed)
Yes, I have advised him

## 2013-02-27 NOTE — Telephone Encounter (Signed)
Patient called wanting to know the appointment for [facility] for his biopsy. I told him he will receive a telephone call from someone as soon as we hear back from [facility]. Patient says he is a patient of Dr. Alwyn Ren.

## 2013-02-27 NOTE — Telephone Encounter (Signed)
The biopsy is scheduled for 1:30 on the same day as the appt with the ENT. Will you see if the ENT can reschedule him?

## 2013-03-01 ENCOUNTER — Other Ambulatory Visit (HOSPITAL_COMMUNITY)
Admission: RE | Admit: 2013-03-01 | Discharge: 2013-03-01 | Disposition: A | Discharge status: Home / Self Care | Payer: Medicare Other | Source: Ambulatory Visit | Attending: Otolaryngology | Admitting: Otolaryngology

## 2013-03-01 ENCOUNTER — Other Ambulatory Visit: Payer: Self-pay | Admitting: Otolaryngology

## 2013-03-01 DIAGNOSIS — R220 Localized swelling, mass and lump, head: Secondary | ICD-10-CM | POA: Insufficient documentation

## 2013-03-02 ENCOUNTER — Other Ambulatory Visit: Payer: Self-pay | Admitting: Family Medicine

## 2013-03-03 NOTE — Telephone Encounter (Signed)
Dr. Alwyn Ren the biopsy was scheduled the same as the [facility] appt on 03/01/13

## 2013-03-03 NOTE — Telephone Encounter (Signed)
Look in chart and help me figure out about when the biopsy is scheduled for.   I couldn't find it.  If you cannot tell call and find out when biopsy will be done

## 2013-03-04 NOTE — Telephone Encounter (Signed)
Call patient and see if he has yet seen ENT.

## 2013-03-05 ENCOUNTER — Telehealth: Payer: Self-pay | Admitting: Radiology

## 2013-03-05 ENCOUNTER — Other Ambulatory Visit: Payer: Self-pay | Admitting: Radiology

## 2013-03-05 DIAGNOSIS — R590 Localized enlarged lymph nodes: Secondary | ICD-10-CM

## 2013-03-05 NOTE — Telephone Encounter (Signed)
Resent the order for the biopsy, it was scheduled same day as the ENT appt. I advised patient to go for the biopsy but looks like he went to ENT instead. We tried to RS the ENT appt, but he went, so we will have to reschedule the biopsy. I have put the order back in so he can have this rescheduled, to you FYI

## 2013-03-05 NOTE — Telephone Encounter (Signed)
No, ENT appt had to be rescheduled, it was scheduled same time as the biopsy, Lupita Leash is calling today to reschedule the ENT appt.

## 2013-03-05 NOTE — Telephone Encounter (Signed)
Dr Fredia Sorrow Radiologist with [facility] feels as though the biopsy should be done by ENT. I will advise patient and the ENT office.

## 2013-03-06 NOTE — Telephone Encounter (Signed)
Called [facility] Dr Pollyann Kennedy did see patient on Oct 3, I have advised the nurse the radiologist wants Dr Pollyann Kennedy to perform the Biopsy. Also asked for the nurse to send you the office visit note, it is being faxed.

## 2013-03-08 ENCOUNTER — Other Ambulatory Visit: Payer: Self-pay | Admitting: Otolaryngology

## 2013-03-08 DIAGNOSIS — C801 Malignant (primary) neoplasm, unspecified: Secondary | ICD-10-CM

## 2013-03-08 DIAGNOSIS — C100 Malignant neoplasm of vallecula: Secondary | ICD-10-CM

## 2013-03-08 HISTORY — DX: Malignant (primary) neoplasm, unspecified: C80.1

## 2013-03-08 HISTORY — DX: Malignant neoplasm of vallecula: C10.0

## 2013-03-08 HISTORY — PX: OTHER SURGICAL HISTORY: SHX169

## 2013-03-12 NOTE — Telephone Encounter (Signed)
I have gotten the results from this visit and patient has already had the biopsy with Dr Pollyann Kennedy.

## 2013-03-13 ENCOUNTER — Other Ambulatory Visit (HOSPITAL_COMMUNITY): Payer: Self-pay | Admitting: Otolaryngology

## 2013-03-13 DIAGNOSIS — C100 Malignant neoplasm of vallecula: Secondary | ICD-10-CM

## 2013-03-14 ENCOUNTER — Encounter: Payer: Self-pay | Admitting: Radiation Oncology

## 2013-03-14 NOTE — Progress Notes (Signed)
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  Patient presented  1 months ago with symptoms of: Cervical Lymphadenopathy found during his annual physical w/Dr Alwyn Ren  Biopsies of Left Vallecula (if applicable) revealed: 03/08/13 Diagnosis Mouth, biopsy, left vallecula - SQUAMOUS CELL CARCINOMA, SEE COMMENT  Nutrition Status:  Weight changes: normal weight 140-145 lbs  Swallowing status: normal  Plans, if any, for PEG tube: none at this time  Tobacco/Marijuana/Snuff/ETOH use: no tobacco use, social alcohol  Past/Anticipated interventions by otolaryngology, if any: Biopsy of the Left vallecula  Past/Anticipated interventions by medical oncology, if any: none  Referrals yet, to any of the following?  Social Work? no  Dentistry? Dr Kristin Bruins  03/18/13  Swallowing therapy? no  Nutrition? no  Med/Onc? No appointment scheduled at this time  PEG placement? no  SAFETY ISSUES:  Prior radiation? no  Pacemaker/ICD? no  Possible current pregnancy? na  Is the patient on methotrexate? 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 Pt denies pain, difficulty swallowing. He has some loss of appetite w/o weight loss, tires more easily.

## 2013-03-15 ENCOUNTER — Ambulatory Visit
Admission: RE | Admit: 2013-03-15 | Discharge: 2013-03-15 | Disposition: A | Discharge status: Home / Self Care | Payer: Medicare Other | Source: Ambulatory Visit | Attending: Radiation Oncology | Admitting: Radiation Oncology

## 2013-03-15 ENCOUNTER — Telehealth: Payer: Self-pay | Admitting: Hematology and Oncology

## 2013-03-15 ENCOUNTER — Encounter: Payer: Self-pay | Admitting: Radiation Oncology

## 2013-03-15 ENCOUNTER — Encounter: Payer: Self-pay | Admitting: *Deleted

## 2013-03-15 VITALS — BP 131/70 | HR 80 | Temp 98.6°F | Resp 20 | Ht 68.5 in | Wt 144.6 lb

## 2013-03-15 DIAGNOSIS — R599 Enlarged lymph nodes, unspecified: Secondary | ICD-10-CM | POA: Insufficient documentation

## 2013-03-15 DIAGNOSIS — C100 Malignant neoplasm of vallecula: Secondary | ICD-10-CM | POA: Insufficient documentation

## 2013-03-15 DIAGNOSIS — E785 Hyperlipidemia, unspecified: Secondary | ICD-10-CM | POA: Insufficient documentation

## 2013-03-15 DIAGNOSIS — Z8673 Personal history of transient ischemic attack (TIA), and cerebral infarction without residual deficits: Secondary | ICD-10-CM | POA: Insufficient documentation

## 2013-03-15 DIAGNOSIS — C801 Malignant (primary) neoplasm, unspecified: Secondary | ICD-10-CM

## 2013-03-15 DIAGNOSIS — Z79899 Other long term (current) drug therapy: Secondary | ICD-10-CM | POA: Insufficient documentation

## 2013-03-15 HISTORY — DX: Malignant neoplasm of vallecula: C10.0

## 2013-03-15 HISTORY — DX: Sciatica, unspecified side: M54.30

## 2013-03-15 HISTORY — DX: Localized enlarged lymph nodes: R59.0

## 2013-03-15 HISTORY — DX: Hyperlipidemia, unspecified: E78.5

## 2013-03-15 HISTORY — DX: Transient cerebral ischemic attack, unspecified: G45.9

## 2013-03-15 HISTORY — DX: Malignant (primary) neoplasm, unspecified: C80.1

## 2013-03-15 MED ORDER — LARYNGOSCOPY SOLUTION RAD-ONC
15.0000 mL | Freq: Once | TOPICAL | Status: AC
  Administered 2013-03-15: 15 mL via TOPICAL
  Filled 2013-03-15: qty 15

## 2013-03-15 MED ORDER — FLUCONAZOLE 100 MG PO TABS: ORAL_TABLET | ORAL | Status: DC

## 2013-03-15 NOTE — Progress Notes (Signed)
Met with patient during initial consult with Dr. Basilio Cairo.  Introduced myself as his navigator and explained my role as a member of his Care Team.  Pt wife indicated that she is Company secretary.  Showed examples of closed-face and open-face masks to facilitate Dr. Colletta Maryland discussion of the study.  Will begin navigating as L1 (new) patient.  Young Berry, RN, BSN, [facility] Head [facility] Neck Oncology Navigator [phone]

## 2013-03-15 NOTE — Progress Notes (Addendum)
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 male who 1 month ago was found at his annual physical exam to have cervical lymphadenopathy.   CT scan was performed of the neck on 02/22/2013 which revealed (as reviewed at our tumor board): Left tongue base/lingual tonsil region mass is suspicious for  malignancy.  Prominent size necrotic-appearing left level 3 -level 4 lymph node  with smaller although abnormal appearing left level 3 lymph nodes  consistent with metastatic involvement.  Rounded small right neck and upper mediastinal lymph nodes may  represent involvement by tumor  The patient was referred to Dr. Pollyann Kennedy of otolaryngology. Dr. Pollyann Kennedy performed transnasal fiberoptic laryngoscopy. This was done on October 10. A mass is identified in the left follicular with small ulceration centrally. The mass was mostly ovoid in shape and exophytic. The vocal cords moved well. Biopsies were obtained of the vallecular mass.  Biopsy by Dr Pollyann Kennedy revealed: Diagnosis Mouth, biopsy, left vallecula - SQUAMOUS CELL CARCINOMA, SEE COMMENT. Microscopic Comment ADDENDUM: Immunohistochemistry for p16 is strongly and diffusely positive. (JSM:kh 03-12-13) Diagnosis Note There are areas worrisome for invasion. The tumor appears moderately differentiated.   The patient reports he's lost about 5 pounds and his appetite is a little bit depressed. He does not have trouble swallowing. He does not have any significant pain. He is seeing dentistry on 03/18/2013. He denies any history of tobacco abuse and he drinks alcohol in moderation but has never abused  it.  PREVIOUS RADIATION THERAPY: No  PAST MEDICAL HISTORY:  has a past medical history of Essential tremor; Hyperlipidemia; Environmental allergies; Cataracts, both eyes; Cancer of vallecula (03/08/13); Cervical lymphadenopathy; Transient cerebral ischemia; Cancer (03/08/13); Sciatica; and Seizures.    PAST SURGICAL HISTORY: Past Surgical History  Procedure Laterality Date  . Vasectomy    . Cataract extraction    . Tooth extraction    . Tonsillectomy    . Biospy of left vallecula Left 03/08/13    FAMILY HISTORY: family history includes Breast cancer in his mother; Diabetes in his father.  SOCIAL HISTORY:  reports that he has never smoked. He does not have any smokeless tobacco history on file. He reports that he drinks alcohol. He reports that he does not use illicit drugs.  ALLERGIES: Aspirin  MEDICATIONS:  Current Outpatient Prescriptions  Medication Sig Dispense Refill  . clopidogrel (PLAVIX) 75 MG tablet Take 1 tablet (75 mg total) by mouth daily. PATIENT NEEDS OFFICE VISIT FOR ADDITIONAL REFILLS  30 tablet  0  . propranolol (INDERAL) 20 MG tablet Take 1 tablet (20 mg total) by mouth daily. PATIENT NEEDS OFFICE VISIT FOR ADDITIONAL REFILLS  30 tablet  0  . simvastatin (ZOCOR) 20 MG tablet Take 1 tablet (20 mg total) by mouth at bedtime. PATIENT NEEDS OFFICE VISIT FOR ADDITIONAL REFILLS'  30 tablet  0   No current facility-administered medications for this encounter.    REVIEW OF SYSTEMS:  Notable for that above.   PHYSICAL EXAM:  height is 5' 8.5" (1.74 m) and weight is 144 lb 9.6 oz (65.59 kg). His temperature is 98.6 F (37 C). His blood pressure is 131/70 and his pulse is 80. His respiration is  20.   General: Alert and oriented, in no acute distress HEENT: Head is normocephalic. Pupils are equally round and reactive to light. Extraocular movements are intact. Oropharynx is clear upon external visualization Neck: Notable for a mass in the level 3/4 region of the left neck.  This mass is approximately 4 cm in greatest dimension to palpation, I cannot appreciate any other palpable lymphadenopathy in the cervical or supraclavicular neck bilaterally Heart: Regular in rate and rhythm with no murmurs, rubs, or gallops. Chest: Clear to auscultation bilaterally, with no rhonchi, wheezes, or rales. Abdomen: Soft, nontender, nondistended, with no rigidity or guarding. Extremities: No cyanosis or edema. Lymphatics: No concerning lymphadenopathy. Skin: No concerning lesions. Musculoskeletal: symmetric strength and muscle tone throughout. Neurologic: Cranial nerves II through XII are grossly intact. No obvious focalities. Speech is fluent. Coordination is intact. Psychiatric: Judgment and insight are intact. Affect is appropriate. Laryngoscopy: Extending from the left base of tongue is a left vallecular mass which is exophytic and may be coated with thrush. Vocal cords are symmetric and mobile. ECOG = 1  LABORATORY DATA:  Lab Results  Component Value Date   WBC 9.2 02/08/2013   HGB 14.4 02/08/2013   HCT 46.0 02/08/2013   MCV 103.5* 02/08/2013   PLT 190 07/19/2011   CMP     Component Value Date/Time   NA 138 02/08/2013 0951   K 4.4 02/08/2013 0951   CL 103 02/08/2013 0951   CO2 31 02/08/2013 0951   GLUCOSE 111* 02/08/2013 0951   BUN 15 02/08/2013 0951   CREATININE 1.01 02/08/2013 0951   CREATININE 1.06 07/19/2011 0615   CALCIUM 9.3 02/08/2013 0951   PROT 7.5 02/08/2013 0951   ALBUMIN 4.1 02/08/2013 0951   AST 21 02/08/2013 0951   ALT 16 02/08/2013 0951   ALKPHOS 45 02/08/2013 0951   BILITOT 0.9 02/08/2013 0951   GFRNONAA 61* 07/19/2011 0615   GFRAA 71* 07/19/2011 0615         RADIOGRAPHY: Ct Soft Tissue Neck W Contrast  02/22/2013   CLINICAL DATA:  Left neck mass.  EXAM: CT NECK WITH CONTRAST  TECHNIQUE: Multidetector CT imaging of the neck was performed using the standard protocol following the bolus administration of intravenous contrast.  CONTRAST:  75mL OMNIPAQUE IOHEXOL  300 MG/ML  SOLN  COMPARISON:  02/13/2013 thyroid ultrasound and 07/18/2011 CT angiogram of the neck.  FINDINGS: New from the prior CT is a mass within the left tongue base/lingual tonsil region with extension into the vallecula. This apposes the epiglottis but does not clearly invade the epiglottis.  Adenopathy greater on the left with a large necrotic lymph node spanning between the left level 3 and level 4 region measuring 3.2 x 3 cm in transverse dimension spanning over 4.4 cm length displacing and compressing the left internal jugular vein which remains patent. There are few left level 3 suspicious lymph nodes with necrotic centers measuring up to 1.5 x 1.1 cm maximal transverse dimension.  Scattered small slightly rounded lymph nodes right neck (level 2, level 3 and level 4 region as well as superior right peritracheal and right thoracic inlet) lymph node suspicious for metastatic involvement.  Degenerative changes cervical spine with various degrees of spinal stenosis and foraminal narrowing. Prominent degenerative changes C1-2 articulation with erosion of the dens.  Apical lung parenchymal changes suggestive of scarring.  Atherosclerotic type changes aorta with ectatic ascending thoracic aorta measuring up to 4 cm.  Visualized intracranial structures unremarkable.  IMPRESSION: Left tongue base/lingual tonsil region mass  is suspicious for malignancy.  Prominent size necrotic-appearing left level 3 -level 4 lymph node with smaller although abnormal appearing left level 3 lymph nodes consistent with metastatic involvement.  Rounded small right neck and upper mediastinal lymph nodes may represent involvement by tumor. Please see above.  These results will be called to the ordering clinician or representative by the Radiologist Assistant, and communication documented in the PACS Dashboard.   Electronically Signed   By: Bridgett Larsson   On: 02/22/2013 11:31   US Soft Tissue Head/neck  02/13/2013   *RADIOLOGY  REPORT*  Clinical Data: Left lower neck nodule  THYROID ULTRASOUND  Technique: Ultrasound examination of the thyroid gland and adjacent soft tissues was performed.  Comparison:  None.  Findings:  Right thyroid lobe:  4.1 x 1.0 x 1.2 cm. Left thyroid lobe:  3.6 x 1.1 x 1.0 cm. Isthmus:  4 mm in thickness.  Focal nodules:  The echogenicity of the thyroid parenchyma is homogeneous.  Only tiny hypoechoic nodules are present of no more than 3 mm in diameter.  However, just lateral to the left lobe of the thyroid there is a rounded mass which is primarily cystic with peripheral mural thickening.  This mass measures 4.4 x 2.9 x 3.3 cm and percutaneous biopsy is recommended.  CT of the neck with IV contrast may be helpful to assess for possible adenopathy.  Lymphadenopathy:  None visualized.  IMPRESSION:  1.  Negative ultrasound of the thyroid. 2.  However there is a 4.4 cm mass lateral to the left lobe of thyroid which is somewhat cystic with mural thickening.  Consider CT of the neck with IV contrast versus percutaneous biopsy.   Original Report Authenticated By: Dwyane Dee, M.D.      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   As discussed at our tumor board, I think he is an excellent candidate for concurrent chemo radiotherapy. Plan is as below:  1) Refer to med/onc to discuss chemotherapy  2) Will see dentistry for dental evaluation/extractions if needed, counseling, and scatter guards  3) Will refer to social work for social support  4) Will refer to nutrition for nutrition support  5) Will defer to Med/onc to refer for PEG tube placement. Patient is somewhat reluctant to have a PEG tube placed, but I think he understands the benefits of this which we discussed in detail and is willing to proceed  6) Will refer to swallowing therapy for dysphagia prevention  7)  simulation once cleared by dentistry. Anticipate 7 weeks of RT - 70 Gy in 35 fractions.   8) The patient was offered enrollment on our single institutional trial investigating open-faced vs close-face head/shoulder masks used to immobilize patients during head and neck radiotherapy. The patient has elected to enroll on this trial.  In regards to the trial, the patient has voluntarily signed copies of the consent forms and all trial related questions were answered.  9) PET scan to occur on 10-22.  He understands the plans as above could alter if he demonstrates distant metastatic disease.   10) fluconazole for thrush in pharynx.  Hold statin while on this.  It was a pleasure meeting the patient today. We discussed the risks, benefits, and side effects of radiotherapy. He understands salvage neck dissection can be considered if he has residual disease after treatment.  We talked in detail about acute and late effects.  He understands that some of the most bothersome acute effects will be significant soreness of the mouth and throat, changes in taste, changes in salivary function, skin irritation, hair loss, dehydration, weight loss and fatigue. We talked about late effects which include but are not necessarily limited to dysphagia, hypothyroidism, dry mouth, trismus, spinal cord or nerve injury, and neck edema. No guarantees of treatment were given. A consent form was signed and placed in the patient's medical record. The patient is enthusiastic about proceeding with treatment. I look forward to participating in the patient's care.  I am scheduled to start maternity leave in early November, sooner if necessary.  I let the patient know that once I am on leave, one of my partners will continue their care. They are comfortable with this plan.   I spent 70 minutes face to face with the patient and more than 50% of that time was spent in counseling and/or coordination of care.     __________________________________________   Lonie Peak, MD

## 2013-03-15 NOTE — Telephone Encounter (Signed)
C/D 03/15/13 for appt. 03/25/13

## 2013-03-15 NOTE — Progress Notes (Signed)
Please see the Nurse Progress Note in the MD Initial Consult Encounter for this patient. 

## 2013-03-15 NOTE — Addendum Note (Signed)
Encounter addended by: Delynn Flavin, RN on: 03/15/2013  6:42 PM<BR>     Documentation filed: Charges VN

## 2013-03-18 ENCOUNTER — Encounter (HOSPITAL_COMMUNITY): Payer: Self-pay | Admitting: Dentistry

## 2013-03-18 ENCOUNTER — Telehealth: Payer: Self-pay | Admitting: Hematology and Oncology

## 2013-03-18 ENCOUNTER — Ambulatory Visit (HOSPITAL_COMMUNITY): Payer: Self-pay | Admitting: Dentistry

## 2013-03-18 ENCOUNTER — Encounter (INDEPENDENT_AMBULATORY_CARE_PROVIDER_SITE_OTHER): Payer: Self-pay

## 2013-03-18 VITALS — BP 109/65 | HR 60 | Temp 97.8°F

## 2013-03-18 DIAGNOSIS — K0889 Other specified disorders of teeth and supporting structures: Secondary | ICD-10-CM

## 2013-03-18 DIAGNOSIS — K036 Deposits [accretions] on teeth: Secondary | ICD-10-CM

## 2013-03-18 DIAGNOSIS — M270 Developmental disorders of jaws: Secondary | ICD-10-CM

## 2013-03-18 DIAGNOSIS — C100 Malignant neoplasm of vallecula: Secondary | ICD-10-CM

## 2013-03-18 DIAGNOSIS — K0530 Chronic periodontitis, unspecified: Secondary | ICD-10-CM | POA: Insufficient documentation

## 2013-03-18 DIAGNOSIS — IMO0002 Reserved for concepts with insufficient information to code with codable children: Secondary | ICD-10-CM

## 2013-03-18 DIAGNOSIS — K08409 Partial loss of teeth, unspecified cause, unspecified class: Secondary | ICD-10-CM

## 2013-03-18 DIAGNOSIS — Z0189 Encounter for other specified special examinations: Secondary | ICD-10-CM

## 2013-03-18 DIAGNOSIS — K089 Disorder of teeth and supporting structures, unspecified: Secondary | ICD-10-CM

## 2013-03-18 DIAGNOSIS — M264 Malocclusion, unspecified: Secondary | ICD-10-CM

## 2013-03-18 MED ORDER — SODIUM FLUORIDE 1.1 % DT CREA: TOPICAL_CREAM | DENTAL | Status: DC

## 2013-03-18 NOTE — Patient Instructions (Signed)
RADIATION THERAPY AND DECISIONS REGARDING YOUR TEETH  Xerostomia (dry mouth) Your salivary glands may be in the filed of radiation.  Radiation may include all or part of your saliva glands.  This will cause your saliva to dry up and you will have a dry mouth.  The dry mouth will be for the rest of your life unless your radiation oncologist tells you otherwise.  Your saliva has many functions:  Saliva wets your tongue for speaking.  It coats your teeth and the inside of your mouth for easier movement.  It helps with chewing and swallowing food.  It helps clean away harmful acid and toxic products made by the germs in your mouth, therefore it helps prevent cavities.  It kills some germs in your mouth and helps to prevent gum disease.  It helps to carry flavor to your taste buds.  Once you have lost your saliva you will be at higher risk for tooth decay and gum disease.  What can be done to help improve your mouth when there's not enough saliva:  1.  Your dentist may give a prescription for Salagen.  It will not bring back all of your saliva but may bring back some of it.  Also your saliva may be thick and ropy or white and foamy. It will not feel like it use to feel.  2.  You will need to swish with water every time your mouth feels dry.  YOU CANNOT suck on any cough drops, mints, lemon drops, candy, vitamin C or any other products.  You cannot use anything other than water to make your mouth feel less dry.  If you want to drink anything else you have to drink it all at once and brush afterwards.  Be sure to discuss the details of your diet habits with your dentist or hygienist.  Radiation caries: This is decay that happens very quickly once your mouth is very dry due to radiation therapy.  Normally cavities take six months to two years to become a problem.  When you have dry mouth cavities may take as little as eight weeks to cause you a problem.  This is why dental check ups every two  months are necessary as long as you have a dry mouth. Radiation caries typically, but not always, start at your gum line where it is hard to see the cavity.  It is therefore also hard to fill these cavities adequately.  This high rate of cavities happens because your mouth no longer has saliva and therefore the acid made by the germs starts the decay process.  Whenever you eat anything the germs in your mouth change the food into acid.  The acid then burns a small hole in your tooth.  This small hole is the beginning of a cavity.  If this is not treated then it will grow bigger and become a cavity.  The way to avoid this hole getting bigger is to use fluoride every evening as prescribed by your dentist.  You have to make sure that your teeth are very clean before you use the fluoride.  This fluoride in turn will strengthen your teeth and prepare them for another day of fighting acid.  If you develop radiation caries many times the damage is so large that you will have to have all your teeth removed.  This could be a big problem if some of these teeth are in the field of radiation.  Further details of why this could be   a big problem will follow.  (See Osteoradionecrosis).  Loss of taste (dysgeusia) This happens to varying degrees once you've had radiation therapy to your jaw region.  Many times taste is not completely lost but becomes limited.  The loss of taste is mostly due to radiation affecting your taste buds.  However if you have no saliva in your mouth to carry the flavor to your taste buds it would be difficult for your taste buds to taste anything.  That is why using water or a prescription for Salagen prior to meals and during meals may help with some of the taste.  Keep in mind that taste generally returns very slowly over the course of several months or several years after radiation therapy.  Don't give up hope.  Trismus According to your Radiation Oncologist your TMJ or jaw joints are going to be  partially or fully in the field of radiation.  This means that over time the muscles that help you open and close your mouth may get stiff.  This will potentially result in your not being able to open your mouth wide enough or as wide as you can open it now.  Le me give you an example of how slowly this happens and how unaware people are of it.  A gentlemen that had radiation therapy two years ago came back to me complaining that bananas are just too large for him to be able to fit them in between his teeth.  He was not able to open wide enough to bite into a banana.  This happens slowly and over a period of time.  What do we do to try and prevent this?  Your dentist will probably give you a stack of sticks called a trismus exercise device .  This stack will help your remind your muscles and your jaw joint to open up to the same distance every day.  Use these sticks every morning when you wake up according to the instructions given by the dentist.   You must use these sticks for at least one to two years after radiation therapy.  The reason for that is because it happens so slowly and keeps going on for about two years after radiation therapy.  Your hospital dentist will help you monitor your mouth opening and make sure that it's not getting smaller.  Osteoradionecrosis (ORN) This is a condition where your jaw bone after having had radiation therapy becomes very dry.  It has very little blood supply to keep it alive.  If you develop a cavity that turns into an abscess or an infection then the jaw bone does not have enough blood supply to help fight the infection.  At this point it is very likely that the infection could cause the death of your jaw bone.  When you have dead bone it has to be removed.  Therefore you might end up having to have surgery to remove part of your jaw bone, the part of the jaw bone that has been affected.   Healing is also a problem if you are to have surgery in the areas where the bone  has had radiation therapy.  The same reasons apply.  If you have surgery you need more blood supply which is not available.  When blood supply and oxygen are not available again, there is a chance for the bone to die.  Occasionally ORN happens on its own with no obvious reason.  This is quite rare.  We believe that   patients who continue to smoke and/or drink alcohol have a higher chance of having this bone problem.  Therefore once your jaw bone has had radiation therapy if there are any teeth in that area, you should never have them pulled.  You should also never have any surgery on your teeth or gums in that area unless the oral surgeon or Periodontist is aware of your history of radiation. There is some expensive management techniques that might be used to limit your risks.  The risks for ORN either from infection or spontaneous ( or on it's own) are life long.    TRISMUS  Trismus is a condition where the jaw does not allow the mouth to open as wide as it usually does.  This can happen almost suddenly, or in other cases the process is so slow, it is hard to notice it-until it is too far along.  When the jaw joints and/or muscles have been exposed to radiation treatments, the onset of Trismus is very slow.  This is because the muscles are losing their stretching ability over a long period of time, as long as 2 YEARS after the end of radiation.  It is therefore important to exercise these muscles and joints.  TRISMUS EXERCISES   Stack of tongue depressors measuring the same or a little less than the last documented MIO (Maximum Interincisal Opening).  Secure them with a rubber band on both ends.  Place the stack in the patient's mouth, supporting the other end.  Allow 30 seconds for muscle stretching.  Rest for a few seconds.  Repeat 3-5 times  For all radiation patients, this exercise is recommended in the mornings and evenings unless otherwise instructed.  The exercise should be done for  a period of 2 YEARS after the end of radiation.  MIO should be checked routinely on recall dental visits by the general dentist or the hospital dentist.  The patient is advised to report any changes, soreness, or difficulties encountered when doing the exercises.  FLUORIDE TRAYS PATIENT INSTRUCTIONS    Obtain prescription from the pharmacy.  Don't be surprised if it needs to be ordered.   Be sure to let the pharmacy know when you are close to needing a new refill for them to have it ready for you without interruption of Fluoride use.   The best time to use your Fluoride is before bed time.   You must brush your teeth very well and floss before using the Fluoride in order to get the best use out of the Fluoride treatments.   Place 1 drop of Fluoride gel per tooth in the tray.   Place the tray on your lower teeth and/or your upper teeth.  Make sure the trays are seated all the way.  Remember, they only fit one way on your teeth.   Insert for 5 full minutes.   At the end of the 5 minutes, take the trays out.  SPIT OUT excess. .    Do NOT rinse your mouth!    Do NOT eat or drink after treatments for at least 30 minutes.  This is why the best time for your treatments is before bedtime.    Clean the inside of your Fluoride trays using COLD WATER and a toothbrush.    In order to keep your Trays from discoloring and free from odors, soak them overnight in denture cleaners such as Efferdent.  Do not use bleach or non denture products.    Store the trays   in a safe dry place AWAY from any heat until your next treatment.    Bring the trays with you for your next dental check-up.  The dentist will confirm their fit.    If anything happens to your Fluoride trays, or they don't fit as well after any dental work, please let us know as soon as possible. 

## 2013-03-18 NOTE — Progress Notes (Signed)
DENTAL CONSULTATION  Date of Consultation:  03/18/2013 Patient Name:   DORA CLAUSS Date of Birth:   [birth year] Medical Record Number: [id number]  VITALS: BP 109/65  Pulse 60  Temp(Src) 97.8 F (36.6 C) (Oral)   CHIEF COMPLAINT: The patient was referred by Dr. Basilio Cairo for a pre-chemoradiation therapy dental protocol examination.  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 Patient with anticipated chemoradiation therapy. Patient is now seen as part of a medically necessary pre-chemoradiation therapy dental protocol examination.  Patient currently denies acute toothache, swellings, or abscesses. Patient was last seen by his primary dentist 2 months ago for an exam and cleaning. Patient sees Dr. Jeanie Sewer for his dental care. Patient is usually seen on every 6 month basis. Patient has been seeing Dr. Jeanie Sewer for approximately 3 years by his report. The patient denies having any unmet dental needs at this time.   PROBLEM LIST: Patient Active Problem List   Diagnosis Date Noted  . Cancer of vallecula 03/15/2013    Priority: High  . Cancer   . Annual physical exam 02/01/2012  . BPH (benign prostatic hyperplasia) 02/01/2012  . Hx of TIA (transient ischemic attack) and stroke 02/01/2012  . Hyperlipidemia 02/01/2012   PMH: Past Medical History  Diagnosis Date  . Essential tremor   . Hyperlipidemia   . Environmental allergies   . Cataracts, both eyes   . Cancer of vallecula 03/08/13    Left / Hypoharyngeal Mass  . Cervical lymphadenopathy   . Transient cerebral ischemia   . Cancer 03/08/13    left vallecula, squamous cell carcinoma  . Sciatica     history of  . History of TIA (transient ischemic attack)     hx of TIA    PSH: Past Surgical History  Procedure Laterality Date  . Vasectomy    . Cataract extraction    . Tooth extraction    . Tonsillectomy    . Biospy of left vallecula Left 03/08/13     ALLERGIES: Allergies  Allergen Reactions  . Aspirin Hives, Itching and Swelling    MEDICATIONS: Current Outpatient Prescriptions  Medication Sig Dispense Refill  . clopidogrel (PLAVIX) 75 MG tablet Take 1 tablet (75 mg total) by mouth daily. PATIENT NEEDS OFFICE VISIT FOR ADDITIONAL REFILLS  30 tablet  0  . fluconazole (DIFLUCAN) 100 MG tablet Take 2 tablets today, then 1 tablet daily for 6 more days for yeast in throat. Hold simvastatin while on this drug.  8 tablet  0  . propranolol (INDERAL) 20 MG tablet Take 1 tablet (20 mg total) by mouth daily. PATIENT NEEDS OFFICE VISIT FOR ADDITIONAL REFILLS  30 tablet  0  . simvastatin (ZOCOR) 20 MG tablet Take 1 tablet (20 mg total) by mouth at bedtime. PATIENT NEEDS OFFICE VISIT FOR ADDITIONAL REFILLS'  30 tablet  0   No current facility-administered medications for this visit.    LABS: Lab Results  Component Value Date   WBC 9.2 02/08/2013   HGB 14.4 02/08/2013   HCT 46.0 02/08/2013   MCV 103.5* 02/08/2013   PLT 190 07/19/2011      Component Value Date/Time   NA 138 02/08/2013 0951   K 4.4 02/08/2013 0951   CL 103 02/08/2013 0951   CO2 31 02/08/2013 0951   GLUCOSE 111* 02/08/2013 0951   BUN 15 02/08/2013 0951   CREATININE 1.01 02/08/2013 0951   CREATININE 1.06 07/19/2011 0615   CALCIUM 9.3 02/08/2013  4098   GFRNONAA 61* 07/19/2011 0615   GFRAA 71* 07/19/2011 0615   Lab Results  Component Value Date   INR 1.06 07/18/2011   No results found for this basename: PTT    SOCIAL HISTORY: History   Social History  . Marital Status: Married    Spouse Name: N/A    Number of Children: N/A  . Years of Education: N/A   Occupational History  .      Retired [employer] [place]   Social History Main Topics  . Smoking status: Never Smoker   . Smokeless tobacco: Never Used  . Alcohol Use: Yes     Comment: wine occasionally  . Drug Use: No  . Sexual Activity: Yes    Birth Control/ Protection: None   Other Topics Concern  .  Not on file   Social History Narrative  . No narrative on file    FAMILY HISTORY: Family History  Problem Relation Age of Onset  . Diabetes Father   . Breast cancer Mother     REVIEW OF SYSTEMS: Reviewed  with patient and included in the dental record.   DENTAL HISTORY: CHIEF COMPLAINT: The patient was referred by Dr. Basilio Cairo for a pre-chemoradiation therapy dental protocol examination.  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 Patient with anticipated chemoradiation therapy. Patient is now seen as part of a medically necessary pre-chemoradiation therapy dental protocol examination.  Patient currently denies acute toothache, swellings, or abscesses. Patient was last seen by his primary dentist 2 months ago for an exam and cleaning. Patient sees Dr. Jeanie Sewer for his dental care. Patient is usually seen on every 6 month basis. Patient has been seeing Dr. Jeanie Sewer for approximately 3 years by his report. The patient denies having any unmet dental needs at this time.   DENTAL EXAMINATION:  GENERAL: The patient is a well-developed, well-nourished male in no acute distress.  HEAD AND NECK: I am unable to palpate any submandibular lymphadenopathy. The patient denies acute TMJ symptoms. INTRAORAL EXAM: The patient has normal saliva. The patient has a V-shaped palate. Patient has multilobular palatal tori. The patient has bilateral mandibular lingual exostoses in the area of 18-19 and 30-31. DENTITION: The patient is missing tooth numbers 1, 16, 17, 29, and 32. PERIODONTAL: The patient has chronic periodontitis with plaque accumulations, generalized gingival recession, and incipient tooth mobility of mandibular anterior teeth. There is moderate bone loss noted. DENTAL CARIES/SUBOPTIMAL RESTORATIONS: Patient has multiple abfraction lesions.  The patient may be missing a resin on tooth #20 on the buccal aspect. ENDODONTIC:  The patient currently denies acute pulpitis symptoms. I do not see any evidence of periapical pathology or radiolucency. CROWN AND BRIDGE: The patient has several crown restorations that appear to be acceptable at this time. PROSTHODONTIC: The patient has no partial dentures. OCCLUSION: The patient has a poor occlusal scheme secondary to multiple missing teethand supra-eruption and drifting of the unopposed teeth into the edentulous areas. The occlusion, however,  is stable at this time.  RADIOGRAPHIC INTERPRETATION: An orthopantogram was taken and supplemented with a full series of dental radiographs. There are multiple missing teeth. There is supra-eruption and drifting of the unopposed teeth into the edentulous areas. There is moderate bone loss noted. There is no evidence of periapical pathology or radiolucency.  ASSESSMENTS: 1. Chronic periodontitis with bone loss 2. Generalized gingival recession 3. Plaque accumulations 4. Incipient mandibular anterior tooth mobility 5. Multiple missing teeth. 6.  Supra-eruption and drifting of the unopposed teeth into the edentulous areas 7. Poor occlusal scheme but stable occlusion 8. Palatal tori 9. Bilateral mandibular lingual exostoses 10. Multiple abfraction/flexure lesions 11. Possible lost resin on tooth #20 on the buccal aspect 12. Plavix therapy with risk for bleeding with invasive dental procedures  PLAN/RECOMMENDATIONS: 1. I discussed the risks, benefits, and complications of various treatment options with the patient in relationship to his medical and dental conditions, anticipated chemoradiation therapy, and chemoradiation therapy side effects to include xerostomia, radiation caries, trismus, mucositis, taste changes, gum and jawbone changes, and risk for infection and osteoradionecrosis. We discussed various treatment options to include no treatment, multiple extraction of teeth in primary field of radiation therapy with alveoloplasty as  needed, pre-prosthetic surgery as indicated, periodontal therapy, dental restorations, root canal therapy, crown and bridge therapy, implant therapy, and replacement of missing teeth as indicated. We also discussed referral to an oral surgeon for a second opinion. The patient is adamant about NOT having dental extractions at this time. The patient does currently wish to proceed with impressions for the fabrication of scatter protection devices-today. Patient also agrees to use PreviDent 5000 fluoride toothpaste at bedtime.  Patient also agrees to follow up with Dr. Jeanie Sewer for evaluation for a resin on tooth #20 prior to radiation therapy.  Scatter protection devices will be fabricated and inserted tomorrow, October 21 at 11:15 AM. Patient may be scheduled for simulation after that time with Dr. Basilio Cairo.  2. Discussion of findings with medical team and coordination of future medical and dental care as needed.  Charlynne Pander, DDS

## 2013-03-18 NOTE — Telephone Encounter (Signed)
CALLED PT TO SEE IF HE COULD COME IN TODAY AT 1:30 TO SEE DR. PER PT STATED HE HAS ANOTHER APPT AT 1:30 TODAY NOT ABLE TO COME IN WILL NOTIFY DR.

## 2013-03-19 ENCOUNTER — Telehealth: Payer: Self-pay | Admitting: *Deleted

## 2013-03-19 ENCOUNTER — Encounter (HOSPITAL_COMMUNITY): Payer: Self-pay | Admitting: Dentistry

## 2013-03-19 ENCOUNTER — Ambulatory Visit (HOSPITAL_COMMUNITY): Payer: Self-pay | Admitting: Dentistry

## 2013-03-19 ENCOUNTER — Encounter (INDEPENDENT_AMBULATORY_CARE_PROVIDER_SITE_OTHER): Payer: Self-pay

## 2013-03-19 VITALS — BP 114/73 | HR 58 | Temp 98.0°F

## 2013-03-19 DIAGNOSIS — Z463 Encounter for fitting and adjustment of dental prosthetic device: Secondary | ICD-10-CM

## 2013-03-19 DIAGNOSIS — Z0189 Encounter for other specified special examinations: Secondary | ICD-10-CM

## 2013-03-19 DIAGNOSIS — C100 Malignant neoplasm of vallecula: Secondary | ICD-10-CM

## 2013-03-19 NOTE — Patient Instructions (Addendum)

## 2013-03-19 NOTE — Telephone Encounter (Signed)
CALLED PATIENT TO INFORM OF TEST , VISIT WITH DR. Bertis Ruddy, VISIT WITH DR. Sundra Aland AND NUT. APPT., SPOKE WITH PATIENT AND HE IS AWARE OF THESE APPTS.

## 2013-03-19 NOTE — Progress Notes (Signed)
03/19/2013  Patient:            Derrick [name] Date of Birth:  09/16/1922 MRN:                [id number]  BP 114/73  Pulse 58  Temp(Src) 98 F (36.7 C) (Oral)  [name] now presents for insertion of upper and lower scatter protection devices.  PROCEDURE: Appliances were tried in and adjusted as needed. [place]. Trismus device was fabricated at 40 mm and using 24 sticks. Postop instructions were provided and a written and verbal format concerning the use and care of appliances. All questions were answered. Patient to return to clinic for periodic oral examination in approximately 2 weeks during radiation therapy. Patient to call if questions or problems arise before then.  Charlynne Pander, DDS

## 2013-03-20 ENCOUNTER — Encounter (HOSPITAL_COMMUNITY): Payer: Self-pay

## 2013-03-20 ENCOUNTER — Encounter (HOSPITAL_COMMUNITY)
Admission: RE | Admit: 2013-03-20 | Discharge: 2013-03-20 | Disposition: A | Discharge status: Home / Self Care | Payer: Medicare Other | Source: Ambulatory Visit | Attending: Otolaryngology | Admitting: Otolaryngology

## 2013-03-20 DIAGNOSIS — R220 Localized swelling, mass and lump, head: Secondary | ICD-10-CM | POA: Insufficient documentation

## 2013-03-20 DIAGNOSIS — R911 Solitary pulmonary nodule: Secondary | ICD-10-CM | POA: Insufficient documentation

## 2013-03-20 DIAGNOSIS — C100 Malignant neoplasm of vallecula: Secondary | ICD-10-CM

## 2013-03-20 DIAGNOSIS — C760 Malignant neoplasm of head, face and neck: Secondary | ICD-10-CM | POA: Insufficient documentation

## 2013-03-20 DIAGNOSIS — R599 Enlarged lymph nodes, unspecified: Secondary | ICD-10-CM | POA: Insufficient documentation

## 2013-03-20 LAB — GLUCOSE, CAPILLARY: Glucose-Capillary: 99 mg/dL (ref 70–99)

## 2013-03-20 MED ORDER — FLUDEOXYGLUCOSE F - 18 (FDG) INJECTION
20.6000 | Freq: Once | INTRAVENOUS | Status: AC | PRN
  Administered 2013-03-20: 20.6 via INTRAVENOUS

## 2013-03-20 NOTE — Progress Notes (Signed)
Head and Neck Cancer Location of Tumor / Histology: Squamous Cell Carcinoma of the Left Vallecula   Patient presented 1 months ago with symptoms of: Cervical Lymphadenopathy found during his annual physical w/Dr Alwyn Ren   Biopsies of Left Vallecula (if applicable) revealed:  03/08/13  Diagnosis  Mouth, biopsy, left vallecula  - SQUAMOUS CELL CARCINOMA, SEE COMMENT   Nutrition Status:  Weight changes: normal weight 140-145 lbs  Swallowing status: normal  Plans, if any, for PEG tube: none at this time  Tobacco/Marijuana/Snuff/ETOH use: no tobacco use, social alcohol   Past/Anticipated interventions by otolaryngology, if any: Biopsy of the Left vallecula   Past/Anticipated interventions by medical oncology, if any: Gorsuch on 03/25/13   Referrals yet, to any of the following?  Social Work? no  Dentistry? Dr Kristin Bruins 03/18/13  Swallowing therapy? Verdie Mosher on 03/25/13 Nutrition? Zenovia Jarred on 03/26/13 Med/Onc? Dr. Loman Brooklyn on 03/25/13  Financial Counseling on 03/25/13 PEG placement? no SAFETY ISSUES:  Prior radiation? no  Pacemaker/ICD? no  Possible current pregnancy? na  Is the patient on methotrexate? 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 Pt denies pain, difficulty swallowing. He has some loss of appetite w/o weight loss, tires more easily.       03/22/13 Pt here to review PET scan results w/Dr Basilio Cairo and have ct sim. Pt w/o c/o today. 03/25/13 Appointments w/Dr Bertis Ruddy, Med Onc and speech therapist. 03/26/13 appointment w/nutritionist.

## 2013-03-22 ENCOUNTER — Ambulatory Visit
Admission: RE | Admit: 2013-03-22 | Discharge: 2013-03-22 | Disposition: A | Discharge status: Home / Self Care | Payer: Medicare Other | Source: Ambulatory Visit | Attending: Radiation Oncology | Admitting: Radiation Oncology

## 2013-03-22 ENCOUNTER — Encounter: Payer: Self-pay | Admitting: *Deleted

## 2013-03-22 VITALS — BP 120/75 | HR 66 | Temp 97.7°F | Resp 20 | Wt 143.6 lb

## 2013-03-22 DIAGNOSIS — Z510 Encounter for antineoplastic radiation therapy: Secondary | ICD-10-CM | POA: Insufficient documentation

## 2013-03-22 DIAGNOSIS — R110 Nausea: Secondary | ICD-10-CM | POA: Insufficient documentation

## 2013-03-22 DIAGNOSIS — B370 Candidal stomatitis: Secondary | ICD-10-CM | POA: Insufficient documentation

## 2013-03-22 DIAGNOSIS — C100 Malignant neoplasm of vallecula: Secondary | ICD-10-CM | POA: Insufficient documentation

## 2013-03-22 DIAGNOSIS — R599 Enlarged lymph nodes, unspecified: Secondary | ICD-10-CM | POA: Insufficient documentation

## 2013-03-22 DIAGNOSIS — R059 Cough, unspecified: Secondary | ICD-10-CM | POA: Insufficient documentation

## 2013-03-22 DIAGNOSIS — R21 Rash and other nonspecific skin eruption: Secondary | ICD-10-CM | POA: Insufficient documentation

## 2013-03-22 DIAGNOSIS — K5900 Constipation, unspecified: Secondary | ICD-10-CM | POA: Insufficient documentation

## 2013-03-22 DIAGNOSIS — K121 Other forms of stomatitis: Secondary | ICD-10-CM | POA: Insufficient documentation

## 2013-03-22 DIAGNOSIS — Z79899 Other long term (current) drug therapy: Secondary | ICD-10-CM | POA: Insufficient documentation

## 2013-03-22 DIAGNOSIS — R05 Cough: Secondary | ICD-10-CM | POA: Insufficient documentation

## 2013-03-22 MED ORDER — SODIUM CHLORIDE 0.9 % IJ SOLN
10.0000 mL | Freq: Once | INTRAMUSCULAR | Status: AC
  Administered 2013-03-22: 10 mL via INTRAVENOUS

## 2013-03-22 NOTE — Progress Notes (Signed)
Met with patient and his wife prior to and after [facility] and during scheduled appt with Dr. Basilio Cairo to provide ongoing education, support and encouragement.  After SIM, showed them the area where he will receive RT and explained the procedure for coming to RadOnc when he is paged for treatments.  Patient and wife expressed appreciation.  Navigating as L1 (new) patient.  Young Berry, RN, BSN, [facility] Head & Neck Oncology Navigator [phone]

## 2013-03-22 NOTE — Addendum Note (Signed)
Encounter addended by: Delynn Flavin, RN on: 03/22/2013  2:52 PM<BR>     Documentation filed: Charges VN

## 2013-03-22 NOTE — Progress Notes (Signed)
Pt in nursing for IV start prior to ct sim today. Explained process to pt and wife. Unsuccessful 1st attempt in left forearm. IV started per Sam RN LAC.

## 2013-03-22 NOTE — Progress Notes (Signed)
Left AC 22 gauge IV removed by Liborio Nixon, RN. Catheter intact upon removal. Patient tolerated well. Bandaid applied to old insertion site.

## 2013-03-22 NOTE — Progress Notes (Signed)
Radiation Oncology         [phone] ________________________________  Name: Derrick [name] MRN: [id number]  Date: 03/22/2013  DOB: 23-Jan-1923  Follow-Up Visit Note  outpatient  CC: HOPPER,DAVID, MD  [name], MD  Diagnosis: Cancer of vallecula. [id number]  Narrative:  The patient returns today for routine follow-up.  PET scan was fortunately negative for mets. He discussed the nonspecific 1cm lung nodule that will be followed. It is not very dense. He is a nonsmoker and I am doubtful this is cancer. The right neck nodes do not light up, but are somewhat suspicious per CT and I will still cover these with radiotherapy. He is relieved at the fact that his disease is still curable.      He declined extractions with Dr Kristin Bruins.                    ALLERGIES:  is allergic to aspirin.  Meds: Current Outpatient Prescriptions  Medication Sig Dispense Refill  . clopidogrel (PLAVIX) 75 MG tablet Take 1 tablet (75 mg total) by mouth daily. PATIENT NEEDS OFFICE VISIT FOR ADDITIONAL REFILLS  30 tablet  0  . fluconazole (DIFLUCAN) 100 MG tablet Take 2 tablets today, then 1 tablet daily for 6 more days for yeast in throat. Hold simvastatin while on this drug.  8 tablet  0  . propranolol (INDERAL) 20 MG tablet Take 1 tablet (20 mg total) by mouth daily. PATIENT NEEDS OFFICE VISIT FOR ADDITIONAL REFILLS  30 tablet  0  . simvastatin (ZOCOR) 20 MG tablet Take 1 tablet (20 mg total) by mouth at bedtime. PATIENT NEEDS OFFICE VISIT FOR ADDITIONAL REFILLS'  30 tablet  0  . sodium fluoride (PREVIDENT 5000 PLUS) 1.1 % CREA dental cream Apply fluoride to toothbrush. Brush teeth for 2 minutes. Spit out excess. Do NOT rinse.  Repeat nightly.  1 Tube  prn   No current facility-administered medications for this encounter.    Physical Findings: The patient is in no acute distress. Patient is alert and oriented. NAD, lying on exam table under blanket.   Lab Findings: Lab Results  Component Value  Date   WBC 9.2 02/08/2013   HGB 14.4 02/08/2013   HCT 46.0 02/08/2013   MCV 103.5* 02/08/2013   PLT 190 07/19/2011    Radiographic Findings: Ct Soft Tissue Neck W Contrast  02/22/2013   CLINICAL DATA:  Left neck mass.  EXAM: CT NECK WITH CONTRAST  TECHNIQUE: Multidetector CT imaging of the neck was performed using the standard protocol following the bolus administration of intravenous contrast.  CONTRAST:  75mL OMNIPAQUE IOHEXOL 300 MG/ML  SOLN  COMPARISON:  02/13/2013 thyroid ultrasound and 07/18/2011 CT angiogram of the neck.  FINDINGS: New from the prior CT is a mass within the left tongue base/lingual tonsil region with extension into the vallecula. This apposes the epiglottis but does not clearly invade the epiglottis.  Adenopathy greater on the left with a large necrotic lymph node spanning between the left level 3 and level 4 region measuring 3.2 x 3 cm in transverse dimension spanning over 4.4 cm length displacing and compressing the left internal jugular vein which remains patent. There are few left level 3 suspicious lymph nodes with necrotic centers measuring up to 1.5 x 1.1 cm maximal transverse dimension.  Scattered small slightly rounded lymph nodes right neck (level 2, level 3 and level 4 region as well as superior right peritracheal and right thoracic inlet) lymph node suspicious for metastatic involvement.  Degenerative changes cervical spine with various degrees of spinal stenosis and foraminal narrowing. Prominent degenerative changes C1-2 articulation with erosion of the dens.  Apical lung parenchymal changes suggestive of scarring.  Atherosclerotic type changes aorta with ectatic ascending thoracic aorta measuring up to 4 cm.  Visualized intracranial structures unremarkable.  IMPRESSION: Left tongue base/lingual tonsil region mass is suspicious for malignancy.  Prominent size necrotic-appearing left level 3 -level 4 lymph node with smaller although abnormal appearing left level 3 lymph  nodes consistent with metastatic involvement.  Rounded small right neck and upper mediastinal lymph nodes may represent involvement by tumor. Please see above.  These results will be called to the ordering clinician or representative by the Radiologist Assistant, and communication documented in the PACS Dashboard.   Electronically Signed   By: Bridgett Larsson   On: 02/22/2013 11:31   Nm Pet Image Initial (pi) Skull Base To Thigh  03/20/2013   CLINICAL DATA:  Initial treatment strategy for head and neck carcinoma.  EXAM: NUCLEAR MEDICINE PET SKULL BASE TO THIGH  FASTING BLOOD GLUCOSE:  Value: 90 year next healed neck CT 02/22/2013 mg/dl  TECHNIQUE: 16.1 mCi W-96 FDG was injected intravenously. CT data was obtained and used for attenuation correction and anatomic localization only. (This was not acquired as a diagnostic CT examination.) Additional exam technical data entered on technologist worksheet.  COMPARISON:  Neck CT 02/22/2013  FINDINGS: NECK  Hypermetabolic mass at the left base of tongue measuring approximately 23 x 20 mm (image 36) with SUV max = 26.1. There is a cluster of rounded left level 2 lymph nodes beneath the sternocleidomastoid muscle measuring 13 and 14 mm respectively (image 33 and 34). These enlarged lymph nodes have mild metabolic activity for size with SUV max 5.1. Large rounded necrotic lymph node measuring 34 x 35 mm at the level 3 station on the left has minimal peripheral activity with SUV max = 4.9.  CHEST  No hypermetabolic mediastinal or hilar nodes. Within the posterior right lower lobe there is a sub solid 10 mm nodule (image 91).  ABDOMEN/PELVIS  No abnormal hypermetabolic activity within the liver, pancreas, adrenal glands, or spleen. No hypermetabolic lymph nodes in the abdomen or pelvis.  SKELETON  No focal hypermetabolic activity to suggest skeletal metastasis.  IMPRESSION: 1. Intensely hypermetabolic left base of tongue mass consists with primary carcinoma.  2. Rounded abnormal  left level 2 and 3 lymph nodes have relatively low metabolic activity for size but morphologically are suspicious for local ipsilateral nodal metastasis.  3. No clear evidence of contralateral metastasis or distant metastasis. As the presumed local nodal metastasis have relatively low metabolic activity, this could decrease sensitivity for more distant metastasis.  5. A 10 mm right lower lobe sub solid nodule. Recommend attention on follow-up.   Electronically Signed   By: Genevive Bi M.D.   On: 03/20/2013 13:05    Impression/Plan:  Proceed with CT simulation today.  All questions answered today with pt and wife.    I spent 10 minutes face to face with the patient and more than 50% of that time was spent in counseling and/or coordination of care. _____________________________________   Lonie Peak, MD

## 2013-03-22 NOTE — Progress Notes (Signed)
  Radiation Oncology         [phone] ________________________________  Name: AZAREL BANNER MRN: [id number]  Date: 03/22/2013  DOB: [birth year]  SIMULATION AND TREATMENT PLANNING NOTE  outpatient  DIAGNOSIS:  Vallecular Cancer  NARRATIVE:  The patient was brought to the CT Simulation planning suite.  Identity was confirmed.  All relevant records and images related to the planned course of therapy were reviewed.  The patient freely provided informed written consent to proceed with treatment after reviewing the details related to the planned course of therapy. The consent form was witnessed and verified by the simulation staff.    Then, the patient was set-up in a stable reproducible  supine position for radiation therapy.  CT images were obtained.  Surface markings were placed.  The CT images were loaded into the planning software.    TREATMENT PLANNING NOTE: Treatment planning then occurred.  The radiation prescription was entered and confirmed.    A total of 1 medically necessary complex treatment devices were fabricated and supervised by me - aquaplast mask. I have requested : Intensity Modulated Radiotherapy (IMRT) is medically necessary for this case for the following reason:  Parotid, cord, mandible, esophageal sparing..   The patient will receive 70 Gy in 35 fractions.   -----------------------------------  Lonie Peak, MD

## 2013-03-25 ENCOUNTER — Ambulatory Visit: Payer: Medicare Other | Attending: Radiation Oncology

## 2013-03-25 ENCOUNTER — Encounter: Payer: Self-pay | Admitting: Hematology and Oncology

## 2013-03-25 ENCOUNTER — Ambulatory Visit (HOSPITAL_BASED_OUTPATIENT_CLINIC_OR_DEPARTMENT_OTHER): Payer: Medicare Other | Admitting: Lab

## 2013-03-25 ENCOUNTER — Encounter (HOSPITAL_COMMUNITY): Payer: Medicare Other

## 2013-03-25 ENCOUNTER — Ambulatory Visit: Payer: Medicare Other

## 2013-03-25 ENCOUNTER — Encounter: Payer: Self-pay | Admitting: *Deleted

## 2013-03-25 ENCOUNTER — Telehealth: Payer: Self-pay | Admitting: Hematology and Oncology

## 2013-03-25 ENCOUNTER — Ambulatory Visit (HOSPITAL_BASED_OUTPATIENT_CLINIC_OR_DEPARTMENT_OTHER): Payer: Medicare Other | Admitting: Hematology and Oncology

## 2013-03-25 ENCOUNTER — Telehealth: Payer: Self-pay | Admitting: *Deleted

## 2013-03-25 VITALS — BP 112/68 | HR 63 | Temp 97.0°F | Resp 18 | Ht 68.5 in | Wt 144.5 lb

## 2013-03-25 DIAGNOSIS — C100 Malignant neoplasm of vallecula: Secondary | ICD-10-CM

## 2013-03-25 DIAGNOSIS — IMO0001 Reserved for inherently not codable concepts without codable children: Secondary | ICD-10-CM | POA: Insufficient documentation

## 2013-03-25 DIAGNOSIS — R1310 Dysphagia, unspecified: Secondary | ICD-10-CM | POA: Insufficient documentation

## 2013-03-25 LAB — CBC WITH DIFFERENTIAL/PLATELET
Basophils Absolute: 0 10*3/uL (ref 0.0–0.1)
EOS%: 1 % (ref 0.0–7.0)
Eosinophils Absolute: 0.1 10*3/uL (ref 0.0–0.5)
HCT: 41.9 % (ref 38.4–49.9)
HGB: 14.1 g/dL (ref 13.0–17.1)
LYMPH%: 32.8 % (ref 14.0–49.0)
MCV: 97 fL (ref 79.3–98.0)
MONO%: 10.3 % (ref 0.0–14.0)
NEUT#: 5.7 10*3/uL (ref 1.5–6.5)
RDW: 13 % (ref 11.0–14.6)
lymph#: 3.3 10*3/uL (ref 0.9–3.3)

## 2013-03-25 LAB — TECHNOLOGIST REVIEW

## 2013-03-25 LAB — COMPREHENSIVE METABOLIC PANEL (CC13)
ALT: 11 U/L (ref 0–55)
Albumin: 3.6 g/dL (ref 3.5–5.0)
Alkaline Phosphatase: 61 U/L (ref 40–150)
BUN: 14 mg/dL (ref 7.0–26.0)
Calcium: 10 mg/dL (ref 8.4–10.4)
Chloride: 100 mEq/L (ref 98–109)
Creatinine: 1 mg/dL (ref 0.7–1.3)
Glucose: 107 mg/dl (ref 70–140)
Potassium: 4.8 mEq/L (ref 3.5–5.1)

## 2013-03-25 LAB — MAGNESIUM (CC13): Magnesium: 2.3 mg/dl (ref 1.5–2.5)

## 2013-03-25 NOTE — Telephone Encounter (Signed)
Patient called with questions about today's appointments.  I provided clarification and indicated I would be joining him during his appt with Dr. Bertis Ruddy.  Pt expressed appreciation.  Young Berry, RN, BSN, [facility] Head & Neck Oncology Navigator [phone]

## 2013-03-25 NOTE — Telephone Encounter (Signed)
Per staff message and POF I have scheduled appts.  JMW  

## 2013-03-25 NOTE — Progress Notes (Signed)
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 male is here because of newly diagnosed squamous cell carcinoma. According to the patient, he went for routine visit to see his primary care provider and he palpated a lymphadenopathy in the neck. On 02/22/2013, CT scan confirmed abnormalities at the tongue base with associated lymphadenopathy. On 03/08/2013, Dr. Pollyann Kennedy performed fiberoptic laryngoscopy and biopsy which confirmed squamous cell carcinoma in the vallecula which is diffusely positive for immunohistochemistry for P. 16. He was being referred here for evaluation. On 03/20/2013, PET CT scan confirmed hypermetabolic activity in the base of the tongue and regional lymphadenopathy with no evidence of widespread metastasis. The patient is doing well. He denies any swallowing difficulties. No neck pain. The patient have chronic hearing deficit and uses hearing aids regularly. MEDICAL HISTORY:  Past Medical History  Diagnosis Date  . Essential tremor   . Hyperlipidemia   . Environmental allergies   . Cataracts, both eyes   . Cancer of vallecula 03/08/13    Left / Hypoharyngeal Mass  . Cervical lymphadenopathy   . Transient cerebral ischemia   . Cancer 03/08/13    left vallecula, squamous cell carcinoma  . Sciatica     history of  . History of TIA (transient ischemic attack)     hx of TIA    SURGICAL HISTORY: Past Surgical History  Procedure Laterality Date  . Vasectomy    . Cataract extraction    . Tooth extraction    . Tonsillectomy    . Biospy of left vallecula Left 03/08/13    SOCIAL HISTORY: History   Social History  . Marital Status: Married    Spouse  Name: N/A    Number of Children: N/A  . Years of Education: N/A   Occupational History  .      Retired [employer] [place]   Social History Main Topics  . Smoking status: Never Smoker   . Smokeless tobacco: Never Used  . Alcohol Use: Yes     Comment: wine occasionally  . Drug Use: No  . Sexual Activity: Yes    Birth Control/ Protection: None   Other Topics Concern  . Not on file   Social History Narrative  . No narrative on file    FAMILY HISTORY: Family History  Problem Relation Age of Onset  . Diabetes Father   . Breast cancer Mother     ALLERGIES:  is allergic to aspirin.  MEDICATIONS:  Current Outpatient Prescriptions  Medication Sig Dispense Refill  . clopidogrel (PLAVIX) 75 MG tablet Take 1 tablet (75 mg total) by mouth daily. PATIENT NEEDS OFFICE VISIT FOR ADDITIONAL REFILLS  30 tablet  0  . propranolol (INDERAL) 20 MG tablet Take 1 tablet (20 mg total) by mouth daily. PATIENT NEEDS OFFICE VISIT FOR ADDITIONAL REFILLS  30 tablet  0  . simvastatin (ZOCOR) 20 MG tablet Take 1 tablet (20 mg total) by mouth at bedtime. PATIENT NEEDS OFFICE VISIT FOR ADDITIONAL REFILLS'  30 tablet  0  . sodium fluoride (PREVIDENT 5000 PLUS) 1.1 % CREA dental cream Apply fluoride to toothbrush. Brush teeth for 2 minutes. Spit out excess. Do NOT rinse.  Repeat nightly.  1 Tube  prn  No current facility-administered medications for this visit.    REVIEW OF SYSTEMS:   Constitutional: Denies fevers, chills or abnormal night sweats Eyes: Denies blurriness of vision, double vision or watery eyes Ears, nose, mouth, throat, and face: Denies mucositis or sore throat Respiratory: Denies cough, dyspnea or wheezes Cardiovascular: Denies palpitation, chest discomfort or lower extremity swelling Gastrointestinal:  Denies nausea, heartburn or change in bowel habits Skin: Denies abnormal skin rashes Lymphatics: Denies new lymphadenopathy or easy bruising Neurological:Denies numbness,  tingling or new weaknesses Behavioral/Psych: Mood is stable, no new changes  All other systems were reviewed with the patient and are negative.  PHYSICAL EXAMINATION: ECOG PERFORMANCE STATUS: 0 - Asymptomatic  Filed Vitals:   03/25/13 1347  BP: 112/68  Pulse: 63  Temp: 97 F (36.1 C)  Resp: 18   Filed Weights   03/25/13 1347  Weight: 144 lb 8 oz (65.545 kg)    GENERAL:alert, no distress and comfortable SKIN: skin color, texture, turgor are normal, no rashes or significant lesions EYES: normal, conjunctiva are pink and non-injected, sclera clear OROPHARYNX:no exudate, no erythema and lips, buccal mucosa, and tongue normal no abnormalities on inspection of the oral pharynx NECK: supple, thyroid normal size, non-tender, without nodularity LYMPH:  There is palpable lymphadenopathy in the left cervical region but not elsewhere. LUNGS: clear to auscultation and percussion with normal breathing effort HEART: regular rate & rhythm and no murmurs and no lower extremity edema ABDOMEN:abdomen soft, non-tender and normal bowel sounds Musculoskeletal:no cyanosis of digits and no clubbing  PSYCH: alert & oriented x 3 with fluent speech NEURO: no focal motor/sensory deficits  LABORATORY DATA:  I have reviewed the data as listed Recent Results (from the past 2160 hour(s))  COMPREHENSIVE METABOLIC PANEL     Status: Abnormal   Collection Time    02/08/13  9:51 AM      Result Value Range   Sodium 138  135 - 145 mEq/L   Potassium 4.4  3.5 - 5.3 mEq/L   Chloride 103  96 - 112 mEq/L   CO2 31  19 - 32 mEq/L   Glucose, Bld 111 (*) 70 - 99 mg/dL   BUN 15  6 - 23 mg/dL   Creat 7.84  6.96 - 2.95 mg/dL   Total Bilirubin 0.9  0.3 - 1.2 mg/dL   Alkaline Phosphatase 45  39 - 117 U/L   AST 21  0 - 37 U/L   ALT 16  0 - 53 U/L   Total Protein 7.5  6.0 - 8.3 g/dL   Albumin 4.1  3.5 - 5.2 g/dL   Calcium 9.3  8.4 - 28.4 mg/dL  TSH     Status: None   Collection Time    02/08/13  9:51 AM       Result Value Range   TSH 3.251  0.350 - 4.500 uIU/mL  LIPID PANEL     Status: None   Collection Time    02/08/13  9:51 AM      Result Value Range   Cholesterol 144  0 - 200 mg/dL   Comment: ATP III Classification:           < 200        mg/dL        Desirable          200 - 239     mg/dL        Borderline High          >= 240  mg/dL        High         Triglycerides 76  <150 mg/dL   HDL 58  >40 mg/dL   Total CHOL/HDL Ratio 2.5     VLDL 15  0 - 40 mg/dL   LDL Cholesterol 71  0 - 99 mg/dL   Comment:       Total Cholesterol/HDL Ratio:CHD Risk                            Coronary Heart Disease Risk Table                                            Men       Women              1/2 Average Risk              3.4        3.3                  Average Risk              5.0        4.4               2X Average Risk              9.6        7.1               3X Average Risk             23.4       11.0     Use the calculated Patient Ratio above and the CHD Risk table      to determine the patient's CHD Risk.     ATP III Classification (LDL):           < 100        mg/dL         Optimal          100 - 129     mg/dL         Near or Above Optimal          130 - 159     mg/dL         Borderline High          160 - 189     mg/dL         High           > 190        mg/dL         Very High        POCT CBC     Status: Abnormal   Collection Time    02/08/13  9:53 AM      Result Value Range   WBC 9.2  4.6 - 10.2 K/uL   Lymph, poc 2.7  0.6 - 3.4   POC LYMPH PERCENT 29.4  10 - 50 %L   MID (cbc) 0.8  0 - 0.9   POC MID % 8.5  0 - 12 %M   POC Granulocyte 5.7  2 - 6.9   Granulocyte percent 62.1  37 - 80 %G   RBC 4.44 (*) 4.69 - 6.13 M/uL   Hemoglobin 14.4  14.1 -  18.1 g/dL   HCT, POC 16.1  09.6 - 53.7 %   MCV 103.5 (*) 80 - 97 fL   MCH, POC 32.4 (*) 27 - 31.2 pg   MCHC 31.3 (*) 31.8 - 35.4 g/dL   RDW, POC 04.5     Platelet Count, POC 235  142 - 424 K/uL   MPV 8.8  0 - 99.8 fL  IFOBT (OCCULT  BLOOD)     Status: Normal   Collection Time    02/08/13  9:53 AM      Result Value Range   IFOBT Negative    GLUCOSE, CAPILLARY     Status: None   Collection Time    03/20/13  8:46 AM      Result Value Range   Glucose-Capillary 99  70 - 99 mg/dL    RADIOGRAPHIC STUDIES: I have personally reviewed the radiological images as listed and agreed with the findings in the report. Nm Pet Image Initial (pi) Skull Base To Thigh  03/20/2013   CLINICAL DATA:  Initial treatment strategy for head and neck carcinoma.  EXAM: NUCLEAR MEDICINE PET SKULL BASE TO THIGH  FASTING BLOOD GLUCOSE:  Value: 89 year next healed neck CT 02/22/2013 mg/dl  TECHNIQUE: 40.9 mCi W-11 FDG was injected intravenously. CT data was obtained and used for attenuation correction and anatomic localization only. (This was not acquired as a diagnostic CT examination.) Additional exam technical data entered on technologist worksheet.  COMPARISON:  Neck CT 02/22/2013  FINDINGS: NECK  Hypermetabolic mass at the left base of tongue measuring approximately 23 x 20 mm (image 36) with SUV max = 26.1. There is a cluster of rounded left level 2 lymph nodes beneath the sternocleidomastoid muscle measuring 13 and 14 mm respectively (image 33 and 34). These enlarged lymph nodes have mild metabolic activity for size with SUV max 5.1. Large rounded necrotic lymph node measuring 34 x 35 mm at the level 3 station on the left has minimal peripheral activity with SUV max = 4.9.  CHEST  No hypermetabolic mediastinal or hilar nodes. Within the posterior right lower lobe there is a sub solid 10 mm nodule (image 91).  ABDOMEN/PELVIS  No abnormal hypermetabolic activity within the liver, pancreas, adrenal glands, or spleen. No hypermetabolic lymph nodes in the abdomen or pelvis.  SKELETON  No focal hypermetabolic activity to suggest skeletal metastasis.  IMPRESSION: 1. Intensely hypermetabolic left base of tongue mass consists with primary carcinoma.  2. Rounded  abnormal left level 2 and 3 lymph nodes have relatively low metabolic activity for size but morphologically are suspicious for local ipsilateral nodal metastasis.  3. No clear evidence of contralateral metastasis or distant metastasis. As the presumed local nodal metastasis have relatively low metabolic activity, this could decrease sensitivity for more distant metastasis.  5. A 10 mm right lower lobe sub solid nodule. Recommend attention on follow-up.   Electronically Signed   By: Genevive Bi M.D.   On: 03/20/2013 13:05    ASSESSMENT:  Squamous cell carcinoma of the vallecula  PLAN:  #1 squamous cell carcinoma the vallecula T2, N2, M0 The patient understood the reason for concurrent chemoradiation therapy. Due to his profound hearing deficit, I recommend using single agent Erbitux along with radiation weekly. I recommended placement of Infuse-a-Port and feeding tube. I will consult the surgeon to do this immediately. I will order chemotherapy class for the patient Ordering blood work today to establish new baseline. I will see the patient back next week to consider him  for chemotherapy.    Orders Placed This Encounter  Procedures  . CBC with Differential    Standing Status: Future     Number of Occurrences:      Standing Expiration Date: 12/15/2013  . Comprehensive metabolic panel    Standing Status: Future     Number of Occurrences:      Standing Expiration Date: 03/25/2014  . Magnesium    Standing Status: Future     Number of Occurrences:      Standing Expiration Date: 03/25/2014  . Ambulatory referral to General Surgery    Referral Priority:  Routine    Referral Type:  Surgical    Referral Reason:  Specialty Services Required    Requested Specialty:  General Surgery    Number of Visits Requested:  1    All questions were answered. The patient knows to call the clinic with any problems, questions or concerns. I spent 40 minutes counseling the patient face to face. The  total time spent in the appointment was 60 minutes and more than 50% was on counseling.     Denora Wysocki, MD 03/25/2013 2:25 PM

## 2013-03-25 NOTE — Progress Notes (Signed)
Checked in new patient with no financial issues,

## 2013-03-25 NOTE — Progress Notes (Signed)
Met with patient and his wife for part of initial consult with Dr. Bertis Ruddy to provide support and care continuity. Will give patient a call tomorrow to see if he has any questions s/p his visit today.  Navigating as L1 (new) patient.  Young Berry, RN, BSN, [facility] : [facility] Head [facility] Neck Oncology Navigator [phone]

## 2013-03-26 ENCOUNTER — Encounter: Payer: Self-pay | Admitting: *Deleted

## 2013-03-26 ENCOUNTER — Telehealth: Payer: Self-pay | Admitting: Hematology and Oncology

## 2013-03-26 ENCOUNTER — Other Ambulatory Visit: Payer: Medicare Other

## 2013-03-26 ENCOUNTER — Telehealth: Payer: Self-pay | Admitting: *Deleted

## 2013-03-26 ENCOUNTER — Ambulatory Visit: Payer: Medicare Other | Admitting: Nutrition

## 2013-03-26 ENCOUNTER — Other Ambulatory Visit: Payer: Self-pay | Admitting: *Deleted

## 2013-03-26 MED ORDER — LIDOCAINE-PRILOCAINE 2.5-2.5 % EX CREA: TOPICAL_CREAM | CUTANEOUS | Status: DC | PRN

## 2013-03-26 NOTE — Progress Notes (Signed)
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  He is a patient of Dr. Lavena Bullion a such and Dr. Basilio Cairo.  Past medical history includes essential tremor, hyperlipidemia, environmental allergies, and TIA.  Medications include Diflucan, Zocor, and Plavix.  Labs include a glucose of 111 on September 12.  Height: 68.5 inches. Weight: 144.5 pounds October 27. Usual body weight: 156 pounds. BMI: 21.6.  Patient's weight has been stable the past 6 weeks.  He is receiving Erbitux with radiation therapy beginning next week.  He denies difficulty chewing and swallowing at this time.  He reports his appetite is good.  Patient is to be evaluated for feeding tube placement Wednesday, October 29.   Nutrition diagnosis: Patient was educated to consume small, frequent meals with adequate calories and protein to promote weight maintenance.  I have reviewed high protein foods.  I've encouraged patient to consider oral nutrition supplements when appetite diminishes.  I provided samples for him to take today.  I have briefly educated him on strategies for eating with nausea.  We have discussed strategies for eating when mouth becomes sore.  He understands his goal is weight maintenance.  Briefly discussed tube feeding initiation.  Fact sheets were provided.  Contact information was given.  Teach back method was used.  Monitoring, evaluation, goals: Patient will tolerate calories and protein to minimize weight loss.  Tube feedings will be initiated once weight loss exceeds 5%.  Next visit: I will followup with patient during first chemotherapy on Wednesday, November 5.

## 2013-03-26 NOTE — Telephone Encounter (Signed)
Raiford Noble, RN Navigator here with pt request to r/s appt with MD on 11/4. Pt's wife has an appt that morning and he will not be able to come in.  Reviewed with MD, pt can be seen on Monday 11/3 at 3:30pm. Called pt, unable to reach left message for pt to return call.POF to scheduling.

## 2013-03-26 NOTE — Telephone Encounter (Signed)
, °

## 2013-03-27 ENCOUNTER — Ambulatory Visit (INDEPENDENT_AMBULATORY_CARE_PROVIDER_SITE_OTHER): Payer: Medicare Other | Admitting: General Surgery

## 2013-03-27 ENCOUNTER — Encounter (INDEPENDENT_AMBULATORY_CARE_PROVIDER_SITE_OTHER): Payer: Self-pay

## 2013-03-27 ENCOUNTER — Telehealth: Payer: Self-pay

## 2013-03-27 ENCOUNTER — Encounter (INDEPENDENT_AMBULATORY_CARE_PROVIDER_SITE_OTHER): Payer: Self-pay | Admitting: General Surgery

## 2013-03-27 VITALS — BP 98/56 | HR 68 | Temp 98.4°F | Resp 15 | Ht 69.0 in | Wt 144.8 lb

## 2013-03-27 DIAGNOSIS — C100 Malignant neoplasm of vallecula: Secondary | ICD-10-CM

## 2013-03-27 DIAGNOSIS — E440 Moderate protein-calorie malnutrition: Secondary | ICD-10-CM

## 2013-03-27 NOTE — Progress Notes (Signed)
Patient ID: Derrick [name], male   DOB: 13-Jan-1923, 79 y.o.   MRN: [id number]  Chief Complaint  Patient presents with  . New Evaluation    eval for Gtube and Port placement    HPI Derrick [name] is a 79 y.o. male.  He is referred by Dr. Artis Delay at the [facility] for consideration of a feeding gastrostomy and Port-A-Cath to facilitate care of his head and neck cancer. Dr. Janace Hoard is listed as his PCP.  The patient has a past history of essential tremor, TIA on Plavix, and hyperlipidemia. He has never had any abdominal surgery. 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  Recent wellness exam revealed a palpable mass in the left neck. He was referred to Dr. Brynda Peon who performed a thorough exam and diagnosed a squamous cell cancer of the left vallecula.  PET CT scan essentially confirmed hypermetabolic activity in the base of the tongue and regional lymphadenopathy but no evidence of widespread metastasis. He denies swallowing difficulties. Denies voice change. Chronic hearing deficit and using hearing aids.  He is very pleasant. Very talkative and very positive about getting on with his treatment. He is in no distress today. HPI  Past Medical History  Diagnosis Date  . Essential tremor   . Hyperlipidemia   . Environmental allergies   . Cataracts, both eyes   . Cancer of vallecula 03/08/13    Left / Hypoharyngeal Mass  . Cervical lymphadenopathy   . Transient cerebral ischemia   . Cancer 03/08/13    left vallecula, squamous cell carcinoma  . Sciatica     history of  . History of TIA (transient ischemic attack)     hx of TIA  . Stroke     Past Surgical History  Procedure Laterality Date  . Vasectomy    . Cataract extraction    . Tooth extraction    . Tonsillectomy    . Biospy of left vallecula Left 03/08/13    Family History  Problem Relation Age of Onset  . Diabetes Father   . Breast cancer Mother   . Cancer Mother     breast    Social History History   Substance Use Topics  . Smoking status: Never Smoker   . Smokeless tobacco: Never Used  . Alcohol Use: Yes     Comment: wine occasionally    Allergies  Allergen Reactions  . Aspirin Hives, Itching and Swelling    Current Outpatient Prescriptions  Medication Sig Dispense Refill  . clopidogrel (PLAVIX) 75 MG tablet Take 1 tablet (75 mg total) by mouth daily. PATIENT NEEDS OFFICE VISIT FOR ADDITIONAL REFILLS  30 tablet  0  . lidocaine-prilocaine (EMLA) cream Apply topically as needed. Apply to port area 45 mins prior to chemotherapy  30 g  4  . propranolol (INDERAL) 20 MG tablet Take 1 tablet (20 mg total) by mouth daily. PATIENT NEEDS OFFICE VISIT FOR ADDITIONAL REFILLS  30 tablet  0  . simvastatin (ZOCOR) 20 MG tablet Take 1 tablet (20 mg total) by mouth at bedtime. PATIENT NEEDS OFFICE VISIT FOR ADDITIONAL REFILLS'  30 tablet  0  . sodium fluoride (PREVIDENT 5000 PLUS) 1.1 % CREA dental cream Apply fluoride to toothbrush. Brush teeth for 2 minutes. Spit out excess. Do NOT rinse.  Repeat nightly.  1 Tube  prn   No current facility-administered medications for this visit.    Review of Systems Review of Systems  Constitutional: Positive for unexpected weight change. Negative for fever  and chills.       He has lost 5-7 pounds over the past year. Appetite is not as good.  HENT: Negative for congestion, hearing loss, sore throat, trouble swallowing and voice change.   Eyes: Negative for visual disturbance.  Respiratory: Negative for cough and wheezing.   Cardiovascular: Negative for chest pain, palpitations and leg swelling.  Gastrointestinal: Negative for nausea, vomiting, abdominal pain, diarrhea, constipation, blood in stool, abdominal distention, anal bleeding and rectal pain.  Genitourinary: Negative for hematuria and difficulty urinating.  Musculoskeletal: Negative for arthralgias.  Skin: Negative for rash and wound.  Neurological: Negative for seizures, syncope, weakness and  headaches.  Hematological: Negative for adenopathy. Does not bruise/bleed easily.  Psychiatric/Behavioral: Negative for confusion.    Blood pressure 98/56, pulse 68, temperature 98.4 F (36.9 C), temperature source Temporal, resp. rate 15, height 5\' 9"  (1.753 m), weight 144 lb 12.8 oz (65.681 kg).  Physical Exam Physical Exam  Constitutional: He is oriented to person, place, and time. He appears well-developed and well-nourished. No distress.  Thin . Alert. Mild malnutrition. Advanced age. Minimal deconditioning.  BMI 21.65  HENT:  Head: Normocephalic.  Nose: Nose normal.  Mouth/Throat: No oropharyngeal exudate.  Eyes: Conjunctivae and EOM are normal. Pupils are equal, round, and reactive to light. Right eye exhibits no discharge. Left eye exhibits no discharge. No scleral icterus.  Neck: Normal range of motion. Neck supple. No JVD present. No tracheal deviation present. No thyromegaly present.  Palpable mass, 3 cm, left neck in region of left sternocleidomastoid muscle. Slightly mobile.  Cardiovascular: Normal rate, regular rhythm, normal heart sounds and intact distal pulses.   No murmur heard. Pulmonary/Chest: Effort normal and breath sounds normal. No stridor. No respiratory distress. He has no wheezes. He has no rales. He exhibits no tenderness.  Abdominal: Soft. Bowel sounds are normal. He exhibits no distension and no mass. There is no tenderness. There is no rebound and no guarding.  Musculoskeletal: Normal range of motion. He exhibits no edema and no tenderness.  Lymphadenopathy:    He has no cervical adenopathy.  Neurological: He is alert and oriented to person, place, and time. He has normal reflexes. Coordination normal.  Skin: Skin is warm and dry. No rash noted. He is not diaphoretic. No erythema. No pallor.  Psychiatric: He has a normal mood and affect. His behavior is normal. Judgment and thought content normal.    Data Reviewed Office notes from [facility].  Assessment    Squamous cell carcinoma of the vallecula, stage TII N2, M0. He will need concurrent chemotherapy and radiation therapy  I agree with recommendation for Port-A-Cath for chemotherapy and feeding tube to maintain nutrition during his treatment. Hopefully this can be temporary.  History of TIA on Plavix. He will need to discontinue his Plavix for 5 days if okay with his PCP.  Mild to moderate malnutrition     Plan    Will schedule for Port-A-Cath insertion and Stamm gastrostomy tube insertion under general anesthesia as soon as possible in the near future. Hopefully we can do this before his first chemoradiation treatment. If not it will be done shortly thereafter. This depends on the operating room schedule and flexibility.  I discussed the indications, details, techniques, and numerous risk of these 2 procedures with him. He is aware of the risk of bleeding, infection, pneumothorax with chest tube placement, malfunction of the port catheter, abdominal complications such as injury to adjacent organs, wound infection, tube dislodgment. He understands  all these issues. This time all his questions are answered. He is in full agreement with this plan.        Angelia Mould. Derrell Lolling, M.D., [facility] General and Minimally invasive Surgery Breast and Colorectal Surgery Office:   [phone] Pager:   [phone]  03/27/2013, 3:13 PM

## 2013-03-27 NOTE — Patient Instructions (Signed)
You will be scheduled for 2 operations under general anesthesia. First we will put a Port-A-Cath in as we described. Secondly we will put a feeding tube in your stomach, which hopefully will be temporary.  You will need to stay in the hospital at least one night to get over this surgery.  We will need to stop the Plavix for 5 days prior to surgery to make it safe from a bleeding standpoint. We will contact your other doctors to make sure that this is acceptable.       Implanted Port Instructions An implanted port is a central line that has a round shape and is placed under the skin. It is used for long-term IV (intravenous) access for: Medicine. Fluids. Liquid nutrition, such as TPN (total parenteral nutrition). Blood samples. Ports can be placed: In the chest area just below the collarbone (this is the most common place.) In the arms. In the belly (abdomen) area. In the legs. PARTS OF THE PORT A port has 2 main parts: The reservoir. The reservoir is round, disc-shaped, and will be a small, raised area under your skin. The reservoir is the part where a needle is inserted (accessed) to either give medicines or to draw blood. The catheter. The catheter is a long, slender tube that extends from the reservoir. The catheter is placed into a large vein. Medicine that is inserted into the reservoir goes into the catheter and then into the vein. INSERTION OF THE PORT The port is surgically placed in either an operating room or in a procedural area (interventional radiology). Medicine may be given to help you relax during the procedure. The skin where the port will be inserted is numbed (local anesthetic). 1 or 2 small cuts (incisions) will be made in the skin to insert the port. The port can be used after it has been inserted. INCISION SITE CARE The incision site may have small adhesive strips on it. This helps keep the incision site closed. Sometimes, no adhesive strips are placed.  Instead of adhesive strips, a special kind of surgical glue is used to keep the incision closed. If adhesive strips were placed on the incision sites, do not take them off. They will fall off on their own. The incision site may be sore for 1 to 2 days. Pain medicine can help. Do not get the incision site wet. Bathe or shower as directed by your caregiver. The incision site should heal in 5 to 7 days. A small scar may form after the incision has healed. ACCESSING THE PORT Special steps must be taken to access the port: Before the port is accessed, a numbing cream can be placed on the skin. This helps numb the skin over the port site. A sterile technique is used to access the port. The port is accessed with a needle. Only "non-coring" port needles should be used to access the port. Once the port is accessed, a blood return should be checked. This helps ensure the port is in the vein and is not clogged (clotted). If your caregiver believes your port should remain accessed, a clear (transparent) bandage will be placed over the needle site. The bandage and needle will need to be changed every week or as directed by your caregiver. Keep the bandage covering the needle clean and dry. Do not get it wet. Follow your caregiver's instructions on how to take a shower or bath when the port is accessed. If your port does not need to stay accessed, no bandage  is needed over the port. FLUSHING THE PORT Flushing the port keeps it from getting clogged. How often the port is flushed depends on: If a constant infusion is running. If a constant infusion is running, the port may not need to be flushed. If intermittent medicines are given. If the port is not being used. For intermittent medicines: The port will need to be flushed: After medicines have been given. After blood has been drawn. As part of routine maintenance. A port is normally flushed with: Normal saline. Heparin. Follow your caregiver's advice on  how often, how much, and the type of flush to use on your port. IMPORTANT PORT INFORMATION Tell your caregiver if you are allergic to heparin. After your port is placed, you will get a manufacturer's information card. The card has information about your port. Keep this card with you at all times. There are many types of ports available. Know what kind of port you have. In case of an emergency, it may be helpful to wear a medical alert bracelet. This can help alert health care workers that you have a port. The port can stay in for as long as your caregiver believes it is necessary. When it is time for the port to come out, surgery will be done to remove it. The surgery will be similar to how the port was put in. If you are in the hospital or clinic: Your port will be taken care of and flushed by a nurse. If you are at home: A home health care nurse may give medicines and take care of the port. You or a family member can get special training and directions for giving medicine and taking care of the port at home. SEEK IMMEDIATE MEDICAL CARE IF:  Your port does not flush or you are unable to get a blood return. New drainage or pus is coming from the incision. A bad smell is coming from the incision site. You develop swelling or increased redness at the incision site. You develop increased swelling or pain at the port site. You develop swelling or pain in the surrounding skin near the port. You have an oral temperature above 102 F (38.9 C), not controlled by medicine. MAKE SURE YOU:  Understand these instructions. Will watch your condition. Will get help right away if you are not doing well or get worse. Document Released: 05/16/2005 Document Revised: 08/08/2011 Document Reviewed: 08/07/2008 [facility] Patient Information 2014 [place], [place].           Gastrostomy Tube, Adult A gastrostomy tube is a tube that is placed into the stomach. It is also called a "G-tube." This tube is  used for:  Feeding.  Giving medication. CLEANING THE G-TUBE SITE  Wash your hands with soap and water.  Remove the old dressing (if any). Some styles of G-tubes may need a dressing inserted between the skin and the G-tube. Other types of G-tubes do not require a dressing. Ask your caregiver if a dressing is needed.  Check the area where the tube enters the skin (insertion site) for redness, swelling, or pus-like (purulent) drainage. A small amount of clear or tan liquid drainage is normal. Check to make sure scar tissue (skin) is not growing around the insertion site. This could have a raised, bumpy appearance.  A cotton swab can be used to clean the skin around the tube:  When the G-tube is first put in, a normal saline solution or water can be used to clean the skin.  Mild  soap and warm water can be used when the skin around the G-tube site has healed.  Roll the cotton swab around the G-tube insertion site to remove any drainage or crusting at the insertion site. RESIDUALS Feeding tube residuals are the amount of liquids that are in the stomach at any given time. Residuals may be checked before giving feedings, medications, or as instructed by your caregiver.  Ask your caregiver if there are instances when you would not start tube feedings depending on the amount or type of contents withdrawn from the stomach.  Check residuals by attaching a syringe to the G-tube and pull back on the syringe plunger. Note the amount and return the residual back into the stomach. FLUSHING THE G-TUBE  The G-tube should be periodically flushed with clean warm water to keep it from clogging.  Flush the G-tube after feedings or medications. Draw up 30 mLs of warm water in a syringe. Connect the syringe to the G-tube and slowly push the water into the tube.  Do not push feedings, medications, or flushes rapidly. Flush the G-tube gently and slowly.  Only use syringes made for G-tubes to flush medications  or feedings.  Your caregiver may want the G-tube flushed more often or with more water. If this is the case, follow your caregiver's instructions. FEEDINGS Your caregiver will determine whether feedings are given as a bolus (a certain amount given at one time and at scheduled times) or whether feedings will be given continuously on a feeding pump.   Formulas should be given at room temperature.  If feedings are continuous, no more than 4 hours worth of feedings should be placed in the feeding bag. This helps prevent spoilage or accidental excess infusion.  Cover and place unused formula in the refrigerator.  If feedings are continuous, stop the feedings when medications or flushes are given. Be sure to restart the feedings.  Feeding bags and syringes should be replaced as instructed by your caregiver. GIVING MEDICATION   In general, it is best if all medications are in a liquid form for G-tube administration. Liquid medications are less likely to clog the G-tube.  Mix the liquid medication with 30 mLs (or amount recommended by your caregiver) of warm water.  Draw up the medication into the syringe.  Attach the syringe to the G-tube and slowly push the mixture into the G-tube.  After giving the medication, draw up 30 mLs of warm water in the syringe and slowly flush the G-tube.  For pills or capsules, check with your caregiver first before crushing medications. Some pills are not effective if they are crushed. Some capsules are sustained release medications.  If appropriate, crush the pill or capsule and mix with 30 mLs of warm water. Using the syringe, slowly push the medication through the tube, then flush the tube with another 30 mLs of tap water. G-TUBE PROBLEMS G-tube was pulled out.  Cause: May have been pulled out accidentally.  Solutions: Cover the opening with clean dressing and tape. Call your caregiver right away. The G-tube should be put in as soon as possible (within 4  hours) so the G-tube opening (tract) does not close. The G-tube needs to be put in at a healthcare setting. An X-ray needs to be done to confirm placement before the G-tube can be used again. Redness, irritation, soreness, or foul odor around the gastrostomy site.  Cause: May be caused by leakage or infection.  Solutions: Call your caregiver right away. Large amount of leakage  of fluid or mucus-like liquid present (a large amount means it soaks clothing).  Cause: Many reasons could cause the G-tube to leak.  Solutions: Call your caregiver to discuss the amount of leakage. Skin or scar tissue appears to be growing where tube enters skin.   Cause: Tissue growth may develop around the insertion site if the G-tube is moved or pulled on excessively.  Solutions: Secure tube with tape so that excess movement does not occur. Call your caregiver. G-tube is clogged.  Cause: Thick formula or medication.  Solutions: Try to slowly push warm water into the tube with a large syringe. Never try to push any object into the tube to unclog it. Do not force fluid into the G-tube. If you are unable to unclog the tube, call your caregiver right away. TIPS  Head of Bed [facility]) position refers to the upright position of a person's upper body.  When giving medications or a feeding bolus, keep the HOB up as told by your caregiver. Do this during the feeding and for 1 hour after the feeding or medication administration.  If continuous feedings are being given, it is best to keep the [facility] up as told by your caregiver. When ADLs (Activities of Daily Living) are performed and the [facility] needs to be flat, be sure to turn the feeding pump off. Restart the feeding pump when the [facility] is returned to the recommended height.  Do not pull or put tension on the tube.  To prevent fluid backflow, kink the G-tube before removing the cap or disconnecting a syringe.  Check the G-tube length every day. Measure from the insertion site  to the end of the G-tube. If the length is longer than previous measurements, the tube may be coming out. Call your caregiver if you notice increasing G-tube length.  Oral care, such as brushing teeth, must be continued.  You may need to remove excess air (vent) from the G-tube. Your caregiver will tell you if this is needed.  Always call your caregiver if you have questions or problems with the G-tube. SEEK IMMEDIATE MEDICAL CARE IF:   You have severe abdominal pain, tenderness, or abdominal bloating(distension).  You have nausea or vomiting.  You are constipated or have problems moving your bowels.  The G-tube insertion site is red, swollen, has a foul smell, or has yellow or brown drainage.  You have difficulty breathing or shortness of breath.  You have a fever.  You have a large amount of feeding tube residuals.  The G-tube is clogged and cannot be flushed. MAKE SURE YOU:   Understand these instructions.  Will watch your condition.  Will get help right away if you are not doing well or get worse. Document Released: 07/25/2001 Document Revised: 08/08/2011 Document Reviewed: 09/11/2007 [facility] Patient Information 2014 [place], [place].

## 2013-03-27 NOTE — Telephone Encounter (Signed)
Had to speak to triage nurse to advise, she indicates she will get a message to Derrick [name]. Patient agrees to plan. He wants Dr Alwyn Ren to know he is thankful for his excellent care, and he appreciates every thing he has done. Patient indicates he will let me know if there is anything else he needs.

## 2013-03-27 NOTE — Telephone Encounter (Signed)
Derrick [name] with [facility]  states patient can stop his plavix

## 2013-03-27 NOTE — Telephone Encounter (Signed)
Pt had TIA Feb, 2013. He has been taking plavix since. Patient wants permission to d/c the Plavix, he wants to know if he can stop for the procedure and if he can stop this permanently please advise. His hm# is [phone]

## 2013-03-27 NOTE — Telephone Encounter (Signed)
Discussed with Dr Alwyn Ren, called patient and called Maralyn Sago, to advise Dr Alwyn Ren has given okay for patient to d/c the Plavix for the surgery. He should resume this after the surgery. He should not d/c permanently.

## 2013-03-27 NOTE — Telephone Encounter (Signed)
PT STATES HE HAVE BEEN DIAGNOSED WITH THROAT CANCER AND THEY ARE GOING TO BE DURING A PROCEDURE, HIS DR WOULD LIKE TO KNOW IF THEY CAN STOP THE PLAVIX PLEASE CALL [phone] AND ASK FOR [facility] HIS NURSE

## 2013-03-28 ENCOUNTER — Encounter: Payer: Self-pay | Admitting: *Deleted

## 2013-03-28 ENCOUNTER — Telehealth: Payer: Self-pay | Admitting: *Deleted

## 2013-03-28 NOTE — Telephone Encounter (Signed)
Patient called to inform that his Port implant will be conducted on 04/05/13 s/p 5 days after stopping Plavix.  Patient presently schedule for 1st chemo infusion and RT on 04/03/13.  Pt asked that I relay this information to Dr. Bertis Ruddy along with his indication that he is willing to have first infusion without port.  Relaying information to Drs. Bertis Ruddy and [place] via [place].  Young Berry, RN, BSN, [facility] Head & Neck Oncology Navigator [phone]

## 2013-03-29 ENCOUNTER — Telehealth: Payer: Self-pay | Admitting: *Deleted

## 2013-03-29 ENCOUNTER — Encounter (HOSPITAL_COMMUNITY): Payer: Self-pay | Admitting: Pharmacy Technician

## 2013-03-29 NOTE — Telephone Encounter (Signed)
Per Dr. Bertis Ruddy, called patient with indication that first chemo infusion will proceed as scheduled on 04/03/13.    Young Berry, RN, BSN, [facility] Head & Neck Oncology Navigator [phone]

## 2013-03-30 NOTE — Telephone Encounter (Signed)
Discussed and agree.

## 2013-04-01 ENCOUNTER — Ambulatory Visit: Payer: Medicare Other

## 2013-04-01 ENCOUNTER — Encounter: Payer: Self-pay | Admitting: *Deleted

## 2013-04-01 ENCOUNTER — Ambulatory Visit (HOSPITAL_BASED_OUTPATIENT_CLINIC_OR_DEPARTMENT_OTHER): Payer: Medicare Other | Admitting: Hematology and Oncology

## 2013-04-01 ENCOUNTER — Ambulatory Visit: Payer: Self-pay

## 2013-04-01 ENCOUNTER — Telehealth: Payer: Self-pay | Admitting: Hematology and Oncology

## 2013-04-01 ENCOUNTER — Other Ambulatory Visit (HOSPITAL_COMMUNITY): Payer: Self-pay | Admitting: *Deleted

## 2013-04-01 ENCOUNTER — Encounter: Payer: Self-pay | Admitting: Hematology and Oncology

## 2013-04-01 VITALS — BP 116/65 | HR 63 | Temp 97.5°F | Resp 18 | Ht 69.0 in | Wt 147.2 lb

## 2013-04-01 DIAGNOSIS — Z8673 Personal history of transient ischemic attack (TIA), and cerebral infarction without residual deficits: Secondary | ICD-10-CM

## 2013-04-01 DIAGNOSIS — C100 Malignant neoplasm of vallecula: Secondary | ICD-10-CM

## 2013-04-01 NOTE — Patient Instructions (Signed)
Cetuximab injection What is this medicine? CETUXIMAB (se TUX i mab) is a chemotherapy drug. It targets a specific protein within cancer cells and stops the cells from growing. It is used to treat colorectal cancer and head and neck cancer. This medicine may be used for other purposes; ask your health care provider or pharmacist if you have questions. What should I tell my health care provider before I take this medicine? They need to know if you have any of these conditions: -heart disease -history of irregular heartbeat -history of low levels of calcium, magnesium, or potassium in the blood -lung or breathing disease, like asthma -an unusual or allergic reaction to cetuximab, other medicines, foods, dyes, or preservatives -pregnant or trying to get pregnant -breast-feeding How should I use this medicine? This drug is given as an infusion into a vein. It is administered in a hospital or clinic by a specially trained health care professional. Talk to your pediatrician regarding the use of this medicine in children. Special care may be needed. Overdosage: If you think you have taken too much of this medicine contact a poison control center or emergency room at once. NOTE: This medicine is only for you. Do not share this medicine with others. What if I miss a dose? It is important not to miss your dose. Call your doctor or health care professional if you are unable to keep an appointment. What may interact with this medicine? Interactions are not expected. This list may not describe all possible interactions. Give your health care provider a list of all the medicines, herbs, non-prescription drugs, or dietary supplements you use. Also tell them if you smoke, drink alcohol, or use illegal drugs. Some items may interact with your medicine. What should I watch for while using this medicine? Visit your doctor or health care professional for regular checks on your progress. This drug may make you  feel generally unwell. This is not uncommon, as chemotherapy can affect healthy cells as well as cancer cells. Report any side effects. Continue your course of treatment even though you feel ill unless your doctor tells you to stop. This medicine can make you more sensitive to the sun. Keep out of the sun while taking this medicine and for 2 months after the last dose. If you cannot avoid being in the sun, wear protective clothing and use sunscreen. Do not use sun lamps or tanning beds/booths. You may need blood work done while you are taking this medicine. In some cases, you may be given additional medicines to help with side effects. Follow all directions for their use. Call your doctor or health care professional for advice if you get a fever, chills or sore throat, or other symptoms of a cold or flu. Do not treat yourself. This drug decreases your body's ability to fight infections. Try to avoid being around people who are sick. Avoid taking products that contain aspirin, acetaminophen, ibuprofen, naproxen, or ketoprofen unless instructed by your doctor. These medicines may hide a fever. Do not become pregnant while taking this medicine. Women should inform their doctor if they wish to become pregnant or think they might be pregnant. There is a potential for serious side effects to an unborn child. Use adequate birth control methods. Avoid pregnancy for at least 6 months after your last dose. Talk to your health care professional or pharmacist for more information. Do not breast-feed an infant while taking this medicine or during the 2 months after your last dose. What side effects  may I notice from receiving this medicine? Side effects that you should report to your doctor or health care professional as soon as possible: -allergic reactions like skin rash, itching or hives, swelling of the face, lips, or tongue -breathing problems -changes in vision -fast, irregular heartbeat -feeling faint or  lightheaded, falls -fever, chills -mouth sores -trouble passing urine or change in the amount of urine -unusually weak or tired Side effects that usually do not require medical attention (report to your doctor or health care professional if they continue or are bothersome): -changes in skin like acne, cracks, skin dryness -constipation -diarrhea -headache -nail changes -nausea, vomiting -stomach upset -weight loss This list may not describe all possible side effects. Call your doctor for medical advice about side effects. You may report side effects to FDA at 1-800-FDA-1088. Where should I keep my medicine? This drug is given in a hospital or clinic and will not be stored at home. NOTE: This sheet is a summary. It may not cover all possible information. If you have questions about this medicine, talk to your doctor, pharmacist, or health care provider.  2013, [employer]. (04/06/2010 2:01:41 PM)

## 2013-04-01 NOTE — Telephone Encounter (Signed)
gave pt appt for lab,md and chemo for 11/5 and 04/09/13, emailed [facility] regarding chemo for 11/12

## 2013-04-01 NOTE — Progress Notes (Signed)
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  INTERVAL HISTORY: Derrick [name] 79 y.o. male returns for further followup. He has many appointment this past week. He has attended chemotherapy class recently. Unfortunately, the feeding tube and Infuse-a-Port cannot be placed until end of this week. He denies any swallowing difficulties. He denies any recent fever, chills, night sweats or abnormal weight loss  I have reviewed the past medical history, past surgical history, social history and family history with the patient and they are unchanged from previous note.  ALLERGIES:  is allergic to aspirin.  MEDICATIONS:  Current Outpatient Prescriptions  Medication Sig Dispense Refill  . lidocaine-prilocaine (EMLA) cream Apply 1 application topically daily as needed (For port-a-cath.).      Marland Kitchen PRESCRIPTION MEDICATION Place 1 application into both eyes at bedtime. Sochlor (Sodium Chloride) 5% Ointment      . propranolol (INDERAL) 20 MG tablet Take 20 mg by mouth daily after breakfast.      . simvastatin (ZOCOR) 20 MG tablet Take 20 mg by mouth daily after supper.      . sodium chloride (MURO 128) 5 % ophthalmic solution Place 1 drop into both eyes every morning.      . sodium fluoride (PREVIDENT 5000 PLUS) 1.1 % CREA dental cream Place 1 application onto teeth at bedtime. Apply fluoride to toothbrush. Brush for 2 minutes. Spit out excess. Do not rinse. Repeat nightly.      . clopidogrel (PLAVIX) 75 MG tablet Take 75 mg by mouth daily after breakfast.       No current facility-administered medications for this visit.    REVIEW OF SYSTEMS:   Constitutional: Denies fevers, chills or abnormal weight  loss Behavioral/Psych: Mood is stable, no new changes  All other systems were reviewed with the patient and are negative.  PHYSICAL EXAMINATION: ECOG PERFORMANCE STATUS: 0 - Asymptomatic  Filed Vitals:   04/01/13 1540  BP: 116/65  Pulse: 63  Temp: 97.5 F (36.4 C)  Resp: 18   Filed Weights   04/01/13 1540  Weight: 147 lb 3.2 oz (66.769 kg)    GENERAL:alert, no distress and comfortable Musculoskeletal:no cyanosis of digits and no clubbing  NEURO: alert & oriented x 3 with fluent speech, no focal motor/sensory deficits  LABORATORY DATA:  I have reviewed the data as listed    Component Value Date/Time   NA 135* 03/25/2013 1536   NA 138 02/08/2013 0951   K 4.8 03/25/2013 1536   K 4.4 02/08/2013 0951   CL 103 02/08/2013 0951   CO2 26 03/25/2013 1536   CO2 31 02/08/2013 0951   GLUCOSE 107 03/25/2013 1536   GLUCOSE 111* 02/08/2013 0951   BUN 14.0 03/25/2013 1536   BUN 15 02/08/2013 0951   CREATININE 1.0 03/25/2013 1536   CREATININE 1.01 02/08/2013 0951   CREATININE 1.06 07/19/2011 0615   CALCIUM 10.0 03/25/2013 1536   CALCIUM 9.3 02/08/2013 0951   PROT 8.2 03/25/2013 1536   PROT 7.5 02/08/2013 0951   ALBUMIN 3.6 03/25/2013 1536   ALBUMIN 4.1 02/08/2013 0951   AST 21 03/25/2013 1536   AST 21 02/08/2013 0951   ALT 11 03/25/2013 1536   ALT 16 02/08/2013 0951   ALKPHOS 61 03/25/2013  1536   ALKPHOS 45 02/08/2013 0951   BILITOT 0.51 03/25/2013 1536   BILITOT 0.9 02/08/2013 0951   GFRNONAA 61* 07/19/2011 0615   GFRAA 71* 07/19/2011 0615    No results found for this basename: SPEP, UPEP,  kappa and lambda light chains    Lab Results  Component Value Date   WBC 10.2 03/25/2013   NEUTROABS 5.7 03/25/2013   HGB 14.1 03/25/2013   HCT 41.9 03/25/2013   MCV 97.0 03/25/2013   PLT 268 03/25/2013      Chemistry      Component Value Date/Time   NA 135* 03/25/2013 1536   NA 138 02/08/2013 0951   K 4.8 03/25/2013 1536   K 4.4 02/08/2013 0951   CL 103 02/08/2013 0951   CO2 26  03/25/2013 1536   CO2 31 02/08/2013 0951   BUN 14.0 03/25/2013 1536   BUN 15 02/08/2013 0951   CREATININE 1.0 03/25/2013 1536   CREATININE 1.01 02/08/2013 0951   CREATININE 1.06 07/19/2011 0615      Component Value Date/Time   CALCIUM 10.0 03/25/2013 1536   CALCIUM 9.3 02/08/2013 0951   ALKPHOS 61 03/25/2013 1536   ALKPHOS 45 02/08/2013 0951   AST 21 03/25/2013 1536   AST 21 02/08/2013 0951   ALT 11 03/25/2013 1536   ALT 16 02/08/2013 0951   BILITOT 0.51 03/25/2013 1536   BILITOT 0.9 02/08/2013 0951     ASSESSMENT:  Squamous cell carcinoma of the vallecula  PLAN:  #1 squamous cell carcinoma of the vallecula We discussed about the use of concurrent chemoradiation therapy with weekly cetuximab. I recommend we used his peripheral veins for cycle 1 and once Infuse-a-Port in feeding tube is placed, we will use this Infuse-a-Port next week. His blood work that was done recently is satisfactory. The patient is aware he need blood work done every week before chemotherapy. We discussed some of the risks, benefits and side-effects of Cetuximab. Some of the short term side-effects included, though not limited to, risk of fatigue, weight loss, risk of allergic reactions, pancytopenia, life-threatening infections, need for transfusions of blood products, nausea, vomiting, change in bowel habits, hair loss, admission to hospital for various reasons, and risks of death. The patient is aware that the response rates discussed earlier is not guaranteed.  After a long discussion, patient made an informed decision to proceed with the prescribed plan of care and went ahead to sign the consent form today.  #2 history of transient ischemic attack We will hold his Plavix in anticipation for invasive procedure. The patient is instructed to restart his antiplatelet agent the day after his procedure.  I will see him in the office on a weekly basis while undergoing treatment.  Orders Placed This Encounter   Procedures  . CBC with Differential    Standing Status: Standing     Number of Occurrences: 7     Standing Expiration Date: 04/01/2014  . Comprehensive metabolic panel    Standing Status: Standing     Number of Occurrences: 7     Standing Expiration Date: 04/01/2014  . Magnesium    Standing Status: Standing     Number of Occurrences: 7     Standing Expiration Date: 04/01/2014   All questions were answered. The patient knows to call the clinic with any problems, questions or concerns. No barriers to learning was detected.     [facility], Remonia Otte, MD 04/01/2013 4:04 PM

## 2013-04-02 ENCOUNTER — Ambulatory Visit: Payer: Medicare Other | Admitting: Radiation Oncology

## 2013-04-02 ENCOUNTER — Ambulatory Visit: Payer: Self-pay | Admitting: Hematology and Oncology

## 2013-04-02 ENCOUNTER — Telehealth: Payer: Self-pay | Admitting: Hematology and Oncology

## 2013-04-02 ENCOUNTER — Encounter (HOSPITAL_COMMUNITY)
Admission: RE | Admit: 2013-04-02 | Discharge: 2013-04-02 | Disposition: A | Discharge status: Home / Self Care | Payer: Medicare Other | Source: Ambulatory Visit | Attending: General Surgery | Admitting: General Surgery

## 2013-04-02 ENCOUNTER — Telehealth: Payer: Self-pay | Admitting: *Deleted

## 2013-04-02 ENCOUNTER — Encounter (HOSPITAL_COMMUNITY): Payer: Self-pay

## 2013-04-02 DIAGNOSIS — Z01812 Encounter for preprocedural laboratory examination: Secondary | ICD-10-CM | POA: Diagnosis not present

## 2013-04-02 DIAGNOSIS — C100 Malignant neoplasm of vallecula: Secondary | ICD-10-CM | POA: Diagnosis present

## 2013-04-02 DIAGNOSIS — E785 Hyperlipidemia, unspecified: Secondary | ICD-10-CM | POA: Diagnosis not present

## 2013-04-02 DIAGNOSIS — Z79899 Other long term (current) drug therapy: Secondary | ICD-10-CM | POA: Diagnosis not present

## 2013-04-02 DIAGNOSIS — E440 Moderate protein-calorie malnutrition: Secondary | ICD-10-CM | POA: Diagnosis not present

## 2013-04-02 DIAGNOSIS — Z8673 Personal history of transient ischemic attack (TIA), and cerebral infarction without residual deficits: Secondary | ICD-10-CM | POA: Diagnosis not present

## 2013-04-02 DIAGNOSIS — Z01810 Encounter for preprocedural cardiovascular examination: Secondary | ICD-10-CM | POA: Diagnosis not present

## 2013-04-02 DIAGNOSIS — Z7902 Long term (current) use of antithrombotics/antiplatelets: Secondary | ICD-10-CM | POA: Diagnosis not present

## 2013-04-02 HISTORY — DX: Anorexia: R63.0

## 2013-04-02 LAB — CBC WITH DIFFERENTIAL/PLATELET
Basophils Absolute: 0 10*3/uL (ref 0.0–0.1)
HCT: 42.3 % (ref 39.0–52.0)
Hemoglobin: 14.1 g/dL (ref 13.0–17.0)
Lymphocytes Relative: 30 % (ref 12–46)
Lymphs Abs: 2.6 10*3/uL (ref 0.7–4.0)
MCHC: 33.3 g/dL (ref 30.0–36.0)
MCV: 96.6 fL (ref 78.0–100.0)
Monocytes Absolute: 1.2 10*3/uL — ABNORMAL HIGH (ref 0.1–1.0)
Monocytes Relative: 14 % — ABNORMAL HIGH (ref 3–12)
Neutro Abs: 4.6 10*3/uL (ref 1.7–7.7)
Platelets: 229 10*3/uL (ref 150–400)
RDW: 13.2 % (ref 11.5–15.5)
WBC: 8.5 10*3/uL (ref 4.0–10.5)

## 2013-04-02 LAB — COMPREHENSIVE METABOLIC PANEL
AST: 24 U/L (ref 0–37)
BUN: 17 mg/dL (ref 6–23)
CO2: 30 mEq/L (ref 19–32)
Chloride: 98 mEq/L (ref 96–112)
Creatinine, Ser: 1.08 mg/dL (ref 0.50–1.35)
GFR calc non Af Amer: 59 mL/min — ABNORMAL LOW (ref >90)
Glucose, Bld: 91 mg/dL (ref 70–99)
Total Bilirubin: 0.5 mg/dL (ref 0.3–1.2)

## 2013-04-02 NOTE — Telephone Encounter (Signed)
sed added every tx but 11.12.14 Rose to email MW to add

## 2013-04-02 NOTE — Patient Instructions (Addendum)
Derrick [name]  04/02/2013                           YOUR PROCEDURE IS SCHEDULED ON: 04/05/13               PLEASE REPORT TO SHORT STAY CENTER AT : 11:30 AM               CALL THIS NUMBER IF ANY PROBLEMS THE DAY OF SURGERY :               [phone]                      REMEMBER:   Do not eat food  AFTER MIDNIGHT   May have clear liquids UNTIL 6 HOURS BEFORE SURGERY (8:00 AM)  Clear liquids include soda, tea, black coffee, apple or grape juice, broth.  Take these medicines the morning of surgery with A SIP OF WATER:  PROPRANOLOL / MAY USE EYE DROPS   Do not wear jewelry, make-up   Do not wear lotions, powders, or perfumes.   Do not shave legs or underarms 12 hrs. before surgery (men may shave face)  Do not bring valuables to the hospital.  Contacts, dentures or bridgework may not be worn into surgery.  Leave suitcase in the car. After surgery it may be brought to your room.  For patients admitted to the hospital more than one night, checkout time is 11:00                          The day of discharge.   Patients discharged the day of surgery will not be allowed to drive home                             If going home same day of surgery, must have someone stay with you first                           24 hrs at home and arrange for some one to drive you home from hospital.    Special Instructions:   Please read over the following fact sheets that you were given:               1. DISCONTINUE ASPIRIN AND HERBAL MED 5 DAYS PREOP                      2. [place] PREPARING FOR SURGERY SHEET               3. BRING LIVING WILL                                                 X_____________________________________________________________________        Failure to follow these instructions may result in cancellation of your surgery

## 2013-04-02 NOTE — Telephone Encounter (Signed)
Per staff message and POF I have scheduled appts.  JMW  

## 2013-04-03 ENCOUNTER — Telehealth: Payer: Self-pay | Admitting: Hematology and Oncology

## 2013-04-03 ENCOUNTER — Ambulatory Visit (HOSPITAL_BASED_OUTPATIENT_CLINIC_OR_DEPARTMENT_OTHER): Payer: Medicare Other

## 2013-04-03 ENCOUNTER — Ambulatory Visit
Admission: RE | Admit: 2013-04-03 | Discharge: 2013-04-03 | Disposition: A | Discharge status: Home / Self Care | Payer: Medicare Other | Source: Ambulatory Visit | Attending: Radiation Oncology | Admitting: Radiation Oncology

## 2013-04-03 ENCOUNTER — Ambulatory Visit: Payer: Medicare Other | Admitting: Nutrition

## 2013-04-03 ENCOUNTER — Encounter: Payer: Self-pay | Admitting: *Deleted

## 2013-04-03 VITALS — BP 130/71 | HR 77 | Temp 97.9°F | Resp 20

## 2013-04-03 DIAGNOSIS — C100 Malignant neoplasm of vallecula: Secondary | ICD-10-CM

## 2013-04-03 DIAGNOSIS — Z5112 Encounter for antineoplastic immunotherapy: Secondary | ICD-10-CM

## 2013-04-03 MED ORDER — SODIUM CHLORIDE 0.9 % IV SOLN
Freq: Once | INTRAVENOUS | Status: AC
  Administered 2013-04-03: 08:00:00 via INTRAVENOUS

## 2013-04-03 MED ORDER — DIPHENHYDRAMINE HCL 50 MG/ML IJ SOLN
INTRAMUSCULAR | Status: AC
  Filled 2013-04-03: qty 1

## 2013-04-03 MED ORDER — DIPHENHYDRAMINE HCL 50 MG/ML IJ SOLN
50.0000 mg | Freq: Once | INTRAMUSCULAR | Status: AC
  Administered 2013-04-03: 50 mg via INTRAVENOUS

## 2013-04-03 MED ORDER — CETUXIMAB CHEMO IV INJECTION 200 MG/100ML
400.0000 mg/m2 | Freq: Once | INTRAVENOUS | Status: AC
  Administered 2013-04-03: 700 mg via INTRAVENOUS
  Filled 2013-04-03: qty 350

## 2013-04-03 NOTE — H&P (Signed)
Derrick [name]   MRN:  [id number]   Description: 79 year old male  Provider: Ernestene Mention, MD  Department: [facility]        Diagnoses    Malignant neoplasm of vallecula    -  Primary    146.3    Moderate malnutrition        263.0      Reason for Visit    New Evaluation    eval for Gtube and Port placement        Current Vitals - Last Recorded    BP Pulse Temp(Src) Resp Ht Wt    98/56 68 98.4 F (36.9 C) (Temporal) 15 5\' 9"  (1.753 m) 144 lb 12.8 oz (65.681 kg)       BMI - 21.37 kg/m2                       History and Physical   Ernestene Mention, MD    Status: Signed            Patient ID: Derrick [name], male   DOB: 1922/11/02, 79 y.o.   MRN: [id number]               HPI Derrick [name] is a 79 y.o. male.  He is referred by Dr. Artis Delay at the [facility]  for consideration of a feeding gastrostomy and Port-A-Cath to facilitate care of his head and neck cancer. Dr. Janace Hoard is listed as his PCP.   The patient has a past history of essential tremor, TIA on Plavix, and hyperlipidemia. He has never had any abdominal surgery. 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   Recent wellness exam revealed a palpable mass in the left neck. He was referred to Dr. Brynda Peon who performed a thorough exam and diagnosed a squamous cell cancer of the left vallecula.  PET CT scan essentially confirmed hypermetabolic activity in the base of the tongue and regional lymphadenopathy but no evidence of widespread metastasis. He denies swallowing difficulties. Denies voice change. Chronic hearing deficit and using hearing aids.   He is very pleasant. Very talkative and very positive about getting on with his treatment. He is in no distress today.       Past Medical History   Diagnosis  Date   .  Essential tremor     .  Hyperlipidemia     .  Environmental allergies     .  Cataracts, both eyes     .  Cancer of vallecula  03/08/13       Left / Hypoharyngeal Mass    .  Cervical lymphadenopathy     .  Transient cerebral ischemia     .  Cancer  03/08/13       left vallecula, squamous cell carcinoma   .  Sciatica         history of   .  History of TIA (transient ischemic attack)         hx of TIA   .  Stroke           Past Surgical History   Procedure  Laterality  Date   .  Vasectomy       .  Cataract extraction       .  Tooth extraction       .  Tonsillectomy       .  Biospy of left vallecula  Left  03/08/13  Family History   Problem  Relation  Age of Onset   .  Diabetes  Father     .  Breast cancer  Mother     .  Cancer  Mother         breast        Social History History   Substance Use Topics   .  Smoking status:  Never Smoker    .  Smokeless tobacco:  Never Used   .  Alcohol Use:  Yes         Comment: wine occasionally         Allergies   Allergen  Reactions   .  Aspirin  Hives, Itching and Swelling         Current Outpatient Prescriptions   Medication  Sig  Dispense  Refill   .  clopidogrel (PLAVIX) 75 MG tablet  Take 1 tablet (75 mg total) by mouth daily. PATIENT NEEDS OFFICE VISIT FOR ADDITIONAL REFILLS   30 tablet   0   .  lidocaine-prilocaine (EMLA) cream  Apply topically as needed. Apply to port area 45 mins prior to chemotherapy   30 g   4   .  propranolol (INDERAL) 20 MG tablet  Take 1 tablet (20 mg total) by mouth daily. PATIENT NEEDS OFFICE VISIT FOR ADDITIONAL REFILLS   30 tablet   0   .  simvastatin (ZOCOR) 20 MG tablet  Take 1 tablet (20 mg total) by mouth at bedtime. PATIENT NEEDS OFFICE VISIT FOR ADDITIONAL REFILLS'   30 tablet   0   .  sodium fluoride (PREVIDENT 5000 PLUS) 1.1 % CREA dental cream  Apply fluoride to toothbrush. Brush teeth for 2 minutes. Spit out excess. Do NOT rinse.  Repeat nightly.   1 Tube   prn      .        Review of Systems   Constitutional: Positive for unexpected weight change. Negative for fever and chills.        He has lost 5-7 pounds over the past year.  Appetite is not as good.  HENT: Negative for congestion, hearing loss, sore throat, trouble swallowing and voice change.   Eyes: Negative for visual disturbance.  Respiratory: Negative for cough and wheezing.   Cardiovascular: Negative for chest pain, palpitations and leg swelling.  Gastrointestinal: Negative for nausea, vomiting, abdominal pain, diarrhea, constipation, blood in stool, abdominal distention, anal bleeding and rectal pain.  Genitourinary: Negative for hematuria and difficulty urinating.  Musculoskeletal: Negative for arthralgias.  Skin: Negative for rash and wound.  Neurological: Negative for seizures, syncope, weakness and headaches.  Hematological: Negative for adenopathy. Does not bruise/bleed easily.  Psychiatric/Behavioral: Negative for confusion.      Blood pressure 98/56, pulse 68, temperature 98.4 F (36.9 C), temperature source Temporal, resp. rate 15, height 5\' 9"  (1.753 m), weight 144 lb 12.8 oz (65.681 kg).   Physical Exam   Constitutional: He is oriented to person, place, and time. He appears well-developed and well-nourished. No distress.  Thin . Alert. Mild malnutrition. Advanced age. Minimal deconditioning.  BMI 21.65  HENT:   Head: Normocephalic.   Nose: Nose normal.   Mouth/Throat: No oropharyngeal exudate.  Eyes: Conjunctivae and EOM are normal. Pupils are equal, round, and reactive to light. Right eye exhibits no discharge. Left eye exhibits no discharge. No scleral icterus.  Neck: Normal range of motion. Neck supple. No JVD present. No tracheal deviation present. No thyromegaly present.  Palpable mass, 3 cm,  left neck in region of left sternocleidomastoid muscle. Slightly mobile.  Cardiovascular: Normal rate, regular rhythm, normal heart sounds and intact distal pulses.    No murmur heard. Pulmonary/Chest: Effort normal and breath sounds normal. No stridor. No respiratory distress. He has no wheezes. He has no rales. He exhibits no tenderness.   Abdominal: Soft. Bowel sounds are normal. He exhibits no distension and no mass. There is no tenderness. There is no rebound and no guarding.  Musculoskeletal: Normal range of motion. He exhibits no edema and no tenderness.  Lymphadenopathy:    He has no cervical adenopathy.  Neurological: He is alert and oriented to person, place, and time. He has normal reflexes. Coordination normal.  Skin: Skin is warm and dry. No rash noted. He is not diaphoretic. No erythema. No pallor.  Psychiatric: He has a normal mood and affect. His behavior is normal. Judgment and thought content normal.      Data Reviewed Office notes from [facility].   Assessment    Squamous cell carcinoma of the vallecula, stage TII N2, M0. He will need concurrent chemotherapy and radiation therapy   I agree with recommendation for Port-A-Cath for chemotherapy and feeding tube to maintain nutrition during his treatment. Hopefully this can be temporary.   History of TIA on Plavix. He will need to discontinue his Plavix for 5 days if okay with his PCP.   Mild to moderate malnutrition      Plan    Will schedule for Port-A-Cath insertion and Stamm gastrostomy tube insertion under general anesthesia as soon as possible in the near future. Hopefully we can do this before his first chemoradiation treatment. If not it will be done shortly thereafter. This depends on the operating room schedule and flexibility.   I discussed the indications, details, techniques, and numerous risk of these 2 procedures with him. He is aware of the risk of bleeding, infection, pneumothorax with chest tube placement, malfunction of the port catheter, abdominal complications such as injury to adjacent organs, wound infection, tube dislodgment. He understands all these issues. This time all his questions are answered. He is in full agreement with this plan.           Angelia Mould. Derrell Lolling, M.D., [facility] General and Minimally invasive Surgery Breast and Colorectal Surgery Office:   [phone] Pager:   [phone]

## 2013-04-03 NOTE — Progress Notes (Signed)
Brief followup with patient in chemotherapy.  Patient feels well.  Weight has increased slightly and was documented as 145.13 pounds on November 4.  He is still below his usual body weight.  He reports a good appetite some of the time.  He denies other nutrition side effects.  Nutrition diagnosis: Food and nutrition related knowledge deficit related to new diagnosis of oropharyngeal cancer and associated treatments as evidenced by no prior need for nutrition related information.  Intervention: Patient educated to continue small, frequent, high-calorie, high-protein meals and snacks to minimize weight loss.  Questions were answered.  Teach back method used.  Monitoring, evaluation, goals: Patient will tolerate adequate calories and protein to minimize weight loss throughout treatment.    Next visit: Wednesday, November 12, during chemotherapy.

## 2013-04-03 NOTE — Progress Notes (Signed)
Patient had no reaction to the first treatment of Erbitux. Patient was observed post one hour infusion and had no complaints.

## 2013-04-03 NOTE — Telephone Encounter (Signed)
Talked to pt's wife and they have appt calendar for month of November 2014

## 2013-04-03 NOTE — Progress Notes (Signed)
Met with patient and his wife during first infusion to provide support and encouragement.  In response to patient's request for appointment clarification, I provided an Epic appointment calendar for this month.  Will continue to navigate as L1 (new) patient.  Young Berry, RN, BSN, [facility] Oncology Navigator [phone]

## 2013-04-03 NOTE — Patient Instructions (Addendum)
[facility] Discharge Instructions for Patients Receiving Chemotherapy  Today you received the following chemotherapy agents Erbitux  To help prevent nausea and vomiting after your treatment, we encourage you to take your nausea medication as prescribed.   If you develop nausea and vomiting that is not controlled by your nausea medication, call the clinic.   BELOW ARE SYMPTOMS THAT SHOULD BE REPORTED IMMEDIATELY:  *FEVER GREATER THAN 100.5 F  *CHILLS WITH OR WITHOUT FEVER  NAUSEA AND VOMITING THAT IS NOT CONTROLLED WITH YOUR NAUSEA MEDICATION  *UNUSUAL SHORTNESS OF BREATH  *UNUSUAL BRUISING OR BLEEDING  TENDERNESS IN MOUTH AND THROAT WITH OR WITHOUT PRESENCE OF ULCERS  *URINARY PROBLEMS  *BOWEL PROBLEMS  UNUSUAL RASH Items with * indicate a potential emergency and should be followed up as soon as possible.  Feel free to call the clinic you have any questions or concerns. The clinic phone number is [phone].    

## 2013-04-03 NOTE — Progress Notes (Signed)
IMRT Device Note - outpatient  10.6 delivered field widths represent one set of IMRT treatment devices. The code is [phone].  -----------------------------------  Lonie Peak, MD

## 2013-04-04 ENCOUNTER — Ambulatory Visit
Admission: RE | Admit: 2013-04-04 | Discharge: 2013-04-04 | Disposition: A | Discharge status: Home / Self Care | Payer: Medicare Other | Source: Ambulatory Visit | Attending: Radiation Oncology | Admitting: Radiation Oncology

## 2013-04-04 ENCOUNTER — Telehealth: Payer: Self-pay | Admitting: *Deleted

## 2013-04-04 NOTE — Telephone Encounter (Signed)
Patient has not complaints or questions at this time. Confirmed his next chemo appointment with him.

## 2013-04-04 NOTE — Telephone Encounter (Signed)
Message copied by Wandalee Ferdinand on Thu Apr 04, 2013  2:38 PM ------      Message from: Lenn Sink I      Created: Wed Apr 03, 2013 12:00 PM      Regarding: follow up call       First time Erbitux. No reaction. Dr. Bertis Ruddy. Call in the afternoon after 2 pm. Call the home number, per patient request.  ------

## 2013-04-05 ENCOUNTER — Encounter (HOSPITAL_COMMUNITY): Payer: Medicare Other | Admitting: Anesthesiology

## 2013-04-05 ENCOUNTER — Observation Stay (HOSPITAL_COMMUNITY): Payer: Medicare Other

## 2013-04-05 ENCOUNTER — Encounter (HOSPITAL_COMMUNITY)
Admission: RE | Disposition: A | Discharge status: Home / Self Care | Payer: Self-pay | Source: Ambulatory Visit | Attending: General Surgery

## 2013-04-05 ENCOUNTER — Observation Stay (HOSPITAL_COMMUNITY)
Admission: RE | Admit: 2013-04-05 | Discharge: 2013-04-06 | Disposition: A | Discharge status: Home / Self Care | Payer: Medicare Other | Source: Ambulatory Visit | Attending: General Surgery | Admitting: General Surgery

## 2013-04-05 ENCOUNTER — Encounter (HOSPITAL_COMMUNITY): Payer: Self-pay | Admitting: *Deleted

## 2013-04-05 ENCOUNTER — Ambulatory Visit (HOSPITAL_COMMUNITY): Payer: Medicare Other

## 2013-04-05 ENCOUNTER — Ambulatory Visit
Admission: RE | Admit: 2013-04-05 | Discharge: 2013-04-05 | Disposition: A | Discharge status: Home / Self Care | Payer: Medicare Other | Source: Ambulatory Visit | Attending: Radiation Oncology | Admitting: Radiation Oncology

## 2013-04-05 ENCOUNTER — Ambulatory Visit (HOSPITAL_COMMUNITY): Payer: Medicare Other | Admitting: Anesthesiology

## 2013-04-05 DIAGNOSIS — Z01812 Encounter for preprocedural laboratory examination: Secondary | ICD-10-CM | POA: Insufficient documentation

## 2013-04-05 DIAGNOSIS — E785 Hyperlipidemia, unspecified: Secondary | ICD-10-CM | POA: Insufficient documentation

## 2013-04-05 DIAGNOSIS — C100 Malignant neoplasm of vallecula: Secondary | ICD-10-CM | POA: Diagnosis not present

## 2013-04-05 DIAGNOSIS — Z8673 Personal history of transient ischemic attack (TIA), and cerebral infarction without residual deficits: Secondary | ICD-10-CM | POA: Insufficient documentation

## 2013-04-05 DIAGNOSIS — E440 Moderate protein-calorie malnutrition: Secondary | ICD-10-CM | POA: Insufficient documentation

## 2013-04-05 DIAGNOSIS — Z7902 Long term (current) use of antithrombotics/antiplatelets: Secondary | ICD-10-CM | POA: Insufficient documentation

## 2013-04-05 DIAGNOSIS — Z931 Gastrostomy status: Secondary | ICD-10-CM

## 2013-04-05 DIAGNOSIS — Z01810 Encounter for preprocedural cardiovascular examination: Secondary | ICD-10-CM | POA: Insufficient documentation

## 2013-04-05 DIAGNOSIS — Z79899 Other long term (current) drug therapy: Secondary | ICD-10-CM | POA: Insufficient documentation

## 2013-04-05 HISTORY — PX: GASTROSTOMY: SHX5249

## 2013-04-05 HISTORY — PX: PORTACATH PLACEMENT: SHX2246

## 2013-04-05 SURGERY — INSERTION, TUNNELED CENTRAL VENOUS DEVICE, WITH PORT
Anesthesia: General | Site: Abdomen | Laterality: Right | Wound class: Clean

## 2013-04-05 MED ORDER — CHLORHEXIDINE GLUCONATE 4 % EX LIQD
Freq: Once | CUTANEOUS | Status: DC
  Filled 2013-04-05: qty 15

## 2013-04-05 MED ORDER — SODIUM CHLORIDE 0.9 % IR SOLN
Status: DC | PRN
  Administered 2013-04-05: 13:00:00

## 2013-04-05 MED ORDER — ONDANSETRON HCL 4 MG/2ML IJ SOLN
INTRAMUSCULAR | Status: DC | PRN
  Administered 2013-04-05: 4 mg via INTRAVENOUS

## 2013-04-05 MED ORDER — HEPARIN SODIUM (PORCINE) 5000 UNIT/ML IJ SOLN
Freq: Once | INTRAMUSCULAR | Status: DC
  Filled 2013-04-05: qty 1.2

## 2013-04-05 MED ORDER — PROPRANOLOL HCL 20 MG PO TABS
20.0000 mg | ORAL_TABLET | Freq: Every day | ORAL | Status: DC
Start: 2013-04-06 — End: 2013-04-06
  Administered 2013-04-06: 20 mg via ORAL
  Filled 2013-04-05: qty 1

## 2013-04-05 MED ORDER — LIDOCAINE HCL (PF) 2 % IJ SOLN
INTRAMUSCULAR | Status: DC | PRN
  Administered 2013-04-05: 75 mg

## 2013-04-05 MED ORDER — PHENYLEPHRINE HCL 10 MG/ML IJ SOLN
INTRAMUSCULAR | Status: DC | PRN
  Administered 2013-04-05 (×3): 80 ug via INTRAVENOUS

## 2013-04-05 MED ORDER — HEPARIN SOD (PORK) LOCK FLUSH 100 UNIT/ML IV SOLN
INTRAVENOUS | Status: DC | PRN
  Administered 2013-04-05: 500 [IU]

## 2013-04-05 MED ORDER — LIDOCAINE-EPINEPHRINE 1 %-1:100000 IJ SOLN
INTRAMUSCULAR | Status: DC | PRN
  Administered 2013-04-05: 28 mL

## 2013-04-05 MED ORDER — FENTANYL CITRATE 0.05 MG/ML IJ SOLN: 25.0000 ug | INTRAMUSCULAR | Status: DC | PRN

## 2013-04-05 MED ORDER — LIDOCAINE-EPINEPHRINE 1 %-1:100000 IJ SOLN
INTRAMUSCULAR | Status: AC
  Filled 2013-04-05: qty 1

## 2013-04-05 MED ORDER — DEXAMETHASONE SODIUM PHOSPHATE 10 MG/ML IJ SOLN
INTRAMUSCULAR | Status: DC | PRN
  Administered 2013-04-05: 10 mg via INTRAVENOUS

## 2013-04-05 MED ORDER — ONDANSETRON HCL 4 MG/2ML IJ SOLN
4.0000 mg | Freq: Four times a day (QID) | INTRAMUSCULAR | Status: DC | PRN
  Administered 2013-04-05: 4 mg via INTRAVENOUS
  Filled 2013-04-05: qty 2

## 2013-04-05 MED ORDER — CEFAZOLIN SODIUM-DEXTROSE 2-3 GM-% IV SOLR
INTRAVENOUS | Status: AC
  Filled 2013-04-05: qty 50

## 2013-04-05 MED ORDER — EPHEDRINE SULFATE 50 MG/ML IJ SOLN
INTRAMUSCULAR | Status: DC | PRN
  Administered 2013-04-05 (×2): 10 mg via INTRAVENOUS

## 2013-04-05 MED ORDER — FENTANYL CITRATE 0.05 MG/ML IJ SOLN
25.0000 ug | INTRAMUSCULAR | Status: DC | PRN
  Administered 2013-04-05 (×2): 25 ug via INTRAVENOUS

## 2013-04-05 MED ORDER — ENOXAPARIN SODIUM 40 MG/0.4ML ~~LOC~~ SOLN
40.0000 mg | SUBCUTANEOUS | Status: DC
  Filled 2013-04-05: qty 0.4

## 2013-04-05 MED ORDER — FENTANYL CITRATE 0.05 MG/ML IJ SOLN
INTRAMUSCULAR | Status: AC
  Filled 2013-04-05: qty 2

## 2013-04-05 MED ORDER — PROPOFOL 10 MG/ML IV BOLUS
INTRAVENOUS | Status: DC | PRN
  Administered 2013-04-05: 130 mg via INTRAVENOUS
  Administered 2013-04-05: 20 mg via INTRAVENOUS

## 2013-04-05 MED ORDER — SODIUM FLUORIDE 1.1 % DT CREA: 1.0000 "application " | TOPICAL_CREAM | Freq: Every day | DENTAL | Status: DC

## 2013-04-05 MED ORDER — SIMVASTATIN 20 MG PO TABS
20.0000 mg | ORAL_TABLET | Freq: Every day | ORAL | Status: DC
  Administered 2013-04-05: 20 mg via ORAL
  Filled 2013-04-05 (×2): qty 1

## 2013-04-05 MED ORDER — ONDANSETRON HCL 4 MG PO TABS: 4.0000 mg | ORAL_TABLET | Freq: Four times a day (QID) | ORAL | Status: DC | PRN

## 2013-04-05 MED ORDER — SODIUM CHLORIDE (HYPERTONIC) 2 % OP SOLN
1.0000 [drp] | Freq: Every morning | OPHTHALMIC | Status: DC
  Filled 2013-04-05: qty 15

## 2013-04-05 MED ORDER — OXYCODONE-ACETAMINOPHEN 5-325 MG PO TABS
1.0000 | ORAL_TABLET | ORAL | Status: DC | PRN
  Administered 2013-04-05: 1 via ORAL
  Filled 2013-04-05: qty 1

## 2013-04-05 MED ORDER — POTASSIUM CHLORIDE IN NACL 20-0.9 MEQ/L-% IV SOLN
INTRAVENOUS | Status: DC
  Administered 2013-04-05: 18:00:00 via INTRAVENOUS
  Filled 2013-04-05 (×3): qty 1000

## 2013-04-05 MED ORDER — HEPARIN SOD (PORK) LOCK FLUSH 100 UNIT/ML IV SOLN
INTRAVENOUS | Status: AC
  Filled 2013-04-05: qty 5

## 2013-04-05 MED ORDER — PROMETHAZINE HCL 25 MG/ML IJ SOLN: 6.2500 mg | INTRAMUSCULAR | Status: DC | PRN

## 2013-04-05 MED ORDER — CHLORHEXIDINE GLUCONATE 4 % EX LIQD: 1.0000 "application " | Freq: Once | CUTANEOUS | Status: DC

## 2013-04-05 MED ORDER — LACTATED RINGERS IV SOLN
INTRAVENOUS | Status: DC
  Administered 2013-04-05: 1000 mL via INTRAVENOUS
  Administered 2013-04-05: 14:00:00 via INTRAVENOUS

## 2013-04-05 MED ORDER — FENTANYL CITRATE 0.05 MG/ML IJ SOLN
INTRAMUSCULAR | Status: DC | PRN
  Administered 2013-04-05: 50 ug via INTRAVENOUS
  Administered 2013-04-05: 100 ug via INTRAVENOUS
  Administered 2013-04-05 (×2): 50 ug via INTRAVENOUS

## 2013-04-05 MED ORDER — CEFAZOLIN SODIUM-DEXTROSE 2-3 GM-% IV SOLR
2.0000 g | INTRAVENOUS | Status: AC
  Administered 2013-04-05: 2 g via INTRAVENOUS

## 2013-04-05 MED ORDER — SUCCINYLCHOLINE CHLORIDE 20 MG/ML IJ SOLN
INTRAMUSCULAR | Status: DC | PRN
  Administered 2013-04-05 (×2): 100 mg via INTRAVENOUS

## 2013-04-05 SURGICAL SUPPLY — 49 items
BAG DECANTER FOR FLEXI CONT (MISCELLANEOUS) ×3 IMPLANT
BENZOIN TINCTURE PRP APPL 2/3 (GAUZE/BANDAGES/DRESSINGS) IMPLANT
BLADE HEX COATED 2.75 (ELECTRODE) ×3 IMPLANT
BLADE SURG SZ10 CARB STEEL (BLADE) ×6 IMPLANT
CATH FOLEY 2WAY SLVR  5CC 24FR (CATHETERS) ×1
CATH FOLEY 2WAY SLVR 5CC 24FR (CATHETERS) ×2 IMPLANT
CLOTH BEACON ORANGE TIMEOUT ST (SAFETY) ×3 IMPLANT
DECANTER SPIKE VIAL GLASS SM (MISCELLANEOUS) ×3 IMPLANT
DERMABOND ADVANCED (GAUZE/BANDAGES/DRESSINGS) ×1
DERMABOND ADVANCED .7 DNX12 (GAUZE/BANDAGES/DRESSINGS) ×2 IMPLANT
DRAPE C-ARM 42X120 X-RAY (DRAPES) ×3 IMPLANT
DRAPE LAPAROSCOPIC ABDOMINAL (DRAPES) ×3 IMPLANT
DRAPE POUCH INSTRU U-SHP 10X18 (DRAPES) ×3 IMPLANT
DRSG TEGADERM 6X8 (GAUZE/BANDAGES/DRESSINGS) IMPLANT
ELECT REM PT RETURN 9FT ADLT (ELECTROSURGICAL) ×3
ELECTRODE REM PT RTRN 9FT ADLT (ELECTROSURGICAL) ×2 IMPLANT
GAUZE SPONGE 2X2 8PLY STRL LF (GAUZE/BANDAGES/DRESSINGS) IMPLANT
GAUZE SPONGE 4X4 16PLY XRAY LF (GAUZE/BANDAGES/DRESSINGS) ×3 IMPLANT
GLOVE BIOGEL PI IND STRL 7.0 (GLOVE) ×2 IMPLANT
GLOVE BIOGEL PI INDICATOR 7.0 (GLOVE) ×1
GLOVE EUDERMIC 7 POWDERFREE (GLOVE) ×12 IMPLANT
GOWN PREVENTION PLUS LG XLONG (DISPOSABLE) IMPLANT
GOWN STRL REIN XL XLG (GOWN DISPOSABLE) ×6 IMPLANT
KIT BARDPORT ISP (Port) ×3 IMPLANT
KIT BARDPORT ISP 9.6FR (PORTABLE EQUIPMENT SUPPLIES) IMPLANT
KIT BASIN OR (CUSTOM PROCEDURE TRAY) ×3 IMPLANT
MARKER SKIN DUAL TIP RULER LAB (MISCELLANEOUS) ×3 IMPLANT
NEEDLE HYPO 22GX1.5 SAFETY (NEEDLE) ×3 IMPLANT
NEEDLE HYPO 25X1 1.5 SAFETY (NEEDLE) ×3 IMPLANT
NS IRRIG 1000ML POUR BTL (IV SOLUTION) ×3 IMPLANT
PACK BASIC VI WITH GOWN DISP (CUSTOM PROCEDURE TRAY) ×6 IMPLANT
PENCIL BUTTON HOLSTER BLD 10FT (ELECTRODE) ×3 IMPLANT
SPONGE GAUZE 2X2 STER 10/PKG (GAUZE/BANDAGES/DRESSINGS)
SPONGE GAUZE 4X4 12PLY (GAUZE/BANDAGES/DRESSINGS) IMPLANT
STRIP CLOSURE SKIN 1/2X4 (GAUZE/BANDAGES/DRESSINGS) IMPLANT
SUT ETHILON 2 0 PS N (SUTURE) ×3 IMPLANT
SUT MNCRL AB 4-0 PS2 18 (SUTURE) ×6 IMPLANT
SUT PDS AB 1 CT1 27 (SUTURE) ×6 IMPLANT
SUT PROLENE 2 0 SH DA (SUTURE) ×6 IMPLANT
SUT SILK 2 0 (SUTURE) ×1
SUT SILK 2 0 SH (SUTURE) ×6 IMPLANT
SUT SILK 2-0 18XBRD TIE 12 (SUTURE) ×2 IMPLANT
SUT SILK 3 0 SH CR/8 (SUTURE) ×3 IMPLANT
SUT VIC AB 3-0 SH 27 (SUTURE) ×2
SUT VIC AB 3-0 SH 27XBRD (SUTURE) ×4 IMPLANT
SYR BULB IRRIGATION 50ML (SYRINGE) ×3 IMPLANT
SYR CONTROL 10ML LL (SYRINGE) ×6 IMPLANT
SYRINGE 10CC LL (SYRINGE) ×3 IMPLANT
TOWEL OR 17X26 10 PK STRL BLUE (TOWEL DISPOSABLE) ×3 IMPLANT

## 2013-04-05 NOTE — Op Note (Signed)
Patient Name:           Derrick [name]   Date of Surgery:        04/05/2013  Pre op Diagnosis:      Squamous cell carcinoma of the vallecula, stage T2 N2, M0.  He will need concurrent chemotherapy and radiation therapy   Post op Diagnosis:    Same  Procedure:                 Insertion of 8 French tunnel venous vascular access device, utilizing C-arm  fluoroscopic guidance and positioning                                      Stamm feeding gastrostomy, 24 French Foley with 10 cc balloon.  Surgeon:                     Angelia Mould. Derrell Lolling, M.D., FACS  Assistant:                      none  Operative Indications:   Derrick [name] is a 79 y.o. male. He is referred by Dr. Artis Delay at the [facility] for consideration of a feeding gastrostomy and Port-A-Cath to facilitate care of his head and neck cancer. Dr. Janace Hoard is listed as his PCP.  The patient has a past history of essential tremor, TIA on Plavix, and hyperlipidemia. He has never had any abdominal surgery. 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  Recent wellness exam revealed a palpable mass in the left neck. He was referred to Dr. Brynda Peon who performed a thorough exam and diagnosed a squamous cell cancer of the left vallecula. PET CT scan essentially confirmed hypermetabolic activity in the base of the tongue and regional lymphadenopathy but no evidence of widespread metastasis. He denies swallowing difficulties. Tolerating regular diet.Denies voice change. Chronic hearing deficit and using hearing aids. Because of low BMI and anticipated swallowing difficulties with concomitant chemotherapy and radiation therapy, and he was referred for Port-A-Cath placement and feeding gastrostomy.   Operative Findings:       The port was placed in the right subclavian vein and appears to be well positioned. The feeding gastrostomy was placed in the anterior wall of the gastric antrum and flushes well.  Procedure in Detail:          Following the  induction of general endotracheal anesthesia the patient was positioned with a small roll behind his shoulders and his arms at his sides. The neck and chest was prepped and draped in a sterile fashion. Intravenous antibiotics were given. Surgical time out was performed. 1% Xylocaine with epinephrine was used for local infiltration anesthetic. A right subclavian venipuncture was performed and a guidewire inserted into the superior vena cava under fluoroscopic guidance. Using fluoroscopy I marked a template on the anterior chest wall for positioning of the catheter so the tip would be in the superior vena cava above the right atrium. I made a small incision at the wire insertion site. I made a transverse incision about 2 fingerbreadths below the clavicle and created  a subcutaneous pocket at the level of the pectoralis fascia. . Using a tunneling device I passed  the catheter from the port pocket site to the wire insertion site. Using the template on the chest wall I determined that the catheter should be cut at 20 cm. The  catheter was secured to the port with a locking device and the port and catheter flushed with heparinized saline. The port was sutured to pectoralis fascia with 3 interrupted sutures of 2-0 Prolene. I inserted the dilator and peel-away sheath assembly over the guidewire and into the central venous circulation. The guidewire and dilator were removed. The catheter was threaded through the peel-away sheath the peel-away sheath removed. The catheter flushed well and had good blood return. The catheter was flushed with concentrated  heparin. Fluoroscopy was used and the catheter appeared to be  positioned well with the catheter tip in the superior vena cava and no deformity of the catheter along its course. The subcutaneous tissue was closed the subcutaneous 3-0 Vicryl sutures and the skin closed and scissors closed with subcuticular sutures of 4-0 Monocryl and Dermabond.  The patient was then  reprepped and redraped with his arms out to the sides. The abdomen was prepped and draped in a sterile fashion. A surgical time out was performed. 1% Xylocaine with epinephrine was used as local infiltration anesthetic. I made a short 4 cm incision in the upper midline of the abdominal wall. The fascia was incised in the midline of the abdominal cavity entered. I quickly identified the gastric antrum with the NG tube. We pulled the NG tube back. I placed Babcocks on the gastric antrum. After selecting a site for the gastrostomy tube I placed 2 concentric purse string sutures of 2-0 silk. I then brought a 24 [place] Foley catheter with 10 cc balloon to the operative field. I passed that through a small incision in the left upper quadrant. I then made a gastrotomy with the cautery and a hemostat and passed the catheter into the gastric lumen. I inflated  the balloon and it worked well. The pursestring sutures were tied. The stomach was then sutured to the intra-abdominal wall with multiple interrupted sutures of 3-0 silk. This tacked the stomach up on the wall well. I then pulled the Foley catheter up, pulling the  the balloon was up against the lining of the stomach up against the abdominal wall and I then sutured the tube in place with a 2-0 nylon suture wrapping around the tube. The feeding gastrostomy tube flushed well. There was no leakage. The midline fascia was closed with #1 PDS sutures and the skin closed with subcuticular suture for Monocryl and Dermabond. He tolerated these 2 procedures well without any problem. There were no complications. EBL 20 cc or less. Complications none. Counts correct.       Angelia Mould. Derrell Lolling, M.D., FACS General and Minimally Invasive Surgery Breast and Colorectal Surgery  04/05/2013 1:54 PM

## 2013-04-05 NOTE — Interval H&P Note (Signed)
History and Physical Interval Note:  04/05/2013 11:17 AM  Derrick [name]  has presented today for surgery, with the diagnosis of cancer of the vallecula  The goals and the various methods of treatment have been discussed with the patient and family. After consideration of risks, benefits and other options for treatment, the patient has consented to  Procedure(s): INSERTION PORT-A-CATH (N/A) GASTROSTOMY TUBE (N/A) as a surgical intervention .  The patient's history has been reviewed, patient examined, no change in status, stable for surgery.  I have reviewed the patient's chart and labs.  Questions were answered to the patient's satisfaction.     Ernestene Mention

## 2013-04-05 NOTE — Transfer of Care (Signed)
Immediate Anesthesia Transfer of Care Note  Patient: Derrick [name]  Procedure(s) Performed: Procedure(s): INSERTION PORT-A-CATH (Right) GASTROSTOMY TUBE (Left)  Patient Location: PACU  Anesthesia Type:General  Level of Consciousness: awake, alert , oriented and patient cooperative  Airway & Oxygen Therapy: Patient Spontanous Breathing and Patient connected to face mask oxygen  Post-op Assessment: Report given to PACU RN, Post -op Vital signs reviewed and stable and Patient moving all extremities X 4  Post vital signs: Reviewed and stable  Complications: No apparent anesthesia complications

## 2013-04-05 NOTE — Preoperative (Signed)
Beta Blockers   Reason not to administer Beta Blockers:Took Inderal this am.

## 2013-04-05 NOTE — Anesthesia Preprocedure Evaluation (Signed)
Anesthesia Evaluation  Patient identified by MRN, date of birth, ID band Patient awake  General Assessment Comment:New from the prior CT is a mass within the left tongue base/lingual tonsil region with extension into the vallecula. This apposes the epiglottis but does not clearly invade the epiglottis  He denies swallowing difficulties. Denies voice change. .     Reviewed: Allergy & Precautions, H&P , NPO status , Patient's Chart, lab work & pertinent test results  Airway Mallampati: II TM Distance: >3 FB Neck ROM: Limited    Dental no notable dental hx.    Pulmonary neg pulmonary ROS,  breath sounds clear to auscultation  Pulmonary exam normal       Cardiovascular negative cardio ROS  Rhythm:Regular Rate:Normal     Neuro/Psych TIAnegative psych ROS   GI/Hepatic negative GI ROS, Neg liver ROS,   Endo/Other  negative endocrine ROS  Renal/GU negative Renal ROS  negative genitourinary   Musculoskeletal negative musculoskeletal ROS (+)   Abdominal   Peds negative pediatric ROS (+)  Hematology negative hematology ROS (+)   Anesthesia Other Findings   Reproductive/Obstetrics negative OB ROS                           Anesthesia Physical Anesthesia Plan  ASA: III  Anesthesia Plan: General   Post-op Pain Management:    Induction: Intravenous  Airway Management Planned: Video Laryngoscope Planned  Additional Equipment:   Intra-op Plan:   Post-operative Plan: Extubation in OR  Informed Consent: I have reviewed the patients History and Physical, chart, labs and discussed the procedure including the risks, benefits and alternatives for the proposed anesthesia with the patient or authorized representative who has indicated his/her understanding and acceptance.   Dental advisory given  Plan Discussed with: CRNA and Surgeon  Anesthesia Plan Comments:         Anesthesia Quick  Evaluation

## 2013-04-06 DIAGNOSIS — C100 Malignant neoplasm of vallecula: Secondary | ICD-10-CM | POA: Diagnosis not present

## 2013-04-06 DIAGNOSIS — Z931 Gastrostomy status: Secondary | ICD-10-CM

## 2013-04-06 MED ORDER — OXYCODONE-ACETAMINOPHEN 5-325 MG PO TABS: 1.0000 | ORAL_TABLET | ORAL | Status: AC | PRN

## 2013-04-06 NOTE — Discharge Summary (Signed)
Physician Discharge Summary  Patient ID: Derrick [name] MRN: [id number] DOB/AGE: 1922/10/12 79 y.o.  Admit date: 04/05/2013 Discharge date: 04/06/2013  Admission Diagnoses:  Cancer of the valeculae  Discharge Diagnoses:  same  Active Problems:   Cancer of vallecula   Surgery:  Open Gastrostomy tube placement and portacath  Discharged Condition: stable  Hospital Course:   Had surgery and was kept overnight for observation  Consults: none  Significant Diagnostic Studies: none    Discharge Exam: Blood pressure 115/67, pulse 74, temperature 97.8 F (36.6 C), temperature source Oral, resp. rate 16, height 5\' 9"  (1.753 m), weight 145 lb (65.772 kg), SpO2 97.00%. Incision sites are dry.  No complaints.  Ready for discharge  Disposition: 01-Home or Self Care  Discharge Orders   Future Appointments Provider Department Dept Phone   04/08/2013 8:20 AM [facility] [place] [facility] [phone]   04/09/2013 8:00 AM Mauri [place] [facility] [phone]   04/09/2013 8:20 AM [facility] [place] [facility] [phone]   04/09/2013 8:45 AM Artis Delay, MD [place] [facility] [phone]   04/09/2013 9:00 AM Lurline Hare, MD [place] [facility] [phone]   04/10/2013 8:20 AM [facility] [place] [facility] [phone]   04/10/2013 8:45 AM [facility] [place] [facility] [phone]   04/10/2013 9:45 AM Anabel Bene, RD [facility] [phone]   04/11/2013 8:20 AM [facility] [place] [facility] [phone]   04/12/2013 8:20 AM [facility] [place] [facility] [phone]   04/15/2013 8:20 AM [facility] [place] [facility] [phone]   04/15/2013 10:15 AM Charlynne Pander, DDS [facility] [place] [facility] [phone]   04/16/2013 8:20 AM [facility] [place] [facility] [phone]   04/16/2013 9:00 AM Krista Blue [facility] MED[facility] [phone]   04/17/2013 7:30 AM [facility] [place] [facility] [phone]   04/17/2013 8:20 AM [facility] [place] [facility] [phone]   04/17/2013 9:45 AM Anabel Bene, RD [facility] [phone]   04/18/2013 8:20 AM [facility] [place] [facility] [phone]   04/19/2013 8:20 AM [facility] [place] [facility] [phone]   04/22/2013 8:20 AM [facility] [place] [facility] [phone]   04/23/2013 8:20 AM [facility] [place] [facility] [phone]   04/23/2013 9:00 AM Dava Najjar Idelle Jo [facility] MEDICAL ONCOLOGY [phone]   04/24/2013 8:20 AM [facility] [place] [facility] [phone]   04/24/2013 9:00 AM [facility] [place] [facility] [phone]   04/24/2013 9:45 AM Anabel Bene, RD [facility] MEDICAL ONCOL[facility] [phone]   04/29/2013 8:20 AM [facility] [place] [facility] [phone]   04/30/2013 8:20 AM [facility] [place] [facility]COL[facility] [phone]   04/30/2013 9:00 AM Krista Blue [facility]COL[facility] [phone]   05/01/2013 8:20 AM [facility] [place] [facility] [phone]   05/01/2013 9:00 AM [facility] E[facility] [place] [facility] MED[facility] [phone]   05/01/2013 9:45 AM Anabel Bene, RD   [facility] MEDICAL ONCOLOGY  [phone]   05/02/2013 8:20 AM [facility] [place] [facility] [phone]   05/03/2013 8:20 AM [facility] [place] [facility] [phone]   05/06/2013 8:20 AM [facility] [place] [facility] [phone]   05/06/2013 1:15 PM [facility] MED[facility]COL[facility] [phone]   05/06/2013 1:15 PM [facility] [phone]   05/07/2013 8:20 AM [facility] [place] [facility] [phone]   05/07/2013 9:00 AM Beverely Pace [facility] [place] [facility] MED[facility] [phone]   05-09-2013 8:20 AM [facility] [place] [facility] [phone]   05/09/2013 8:20 AM [facility] [place] [facility] [phone]   05/10/2013 8:20 AM [facility] [place] [facility] [phone]   05/13/2013 8:20 AM [facility] [place] [facility] [phone]   05/14/2013 8:20 AM [facility] [place] [facility] [phone]   05/15/2013 8:20 AM [facility] [place] [facility] [phone]   05/16/2013 8:20 AM [facility] [phone]   05/17/2013 8:20 AM [facility] [place] [facility] [phone]   05/20/2013 8:20 AM [facility] [place] [facility] [phone]   05/21/2013 8:20 AM [facility] [place] [facility] [phone]   05/22/2013 8:20 AM [facility] [place] [facility] [phone]   05/24/2013 8:20 AM [facility]  [facility] RAD[facility] ONCOL[facility] [phone]   Future Orders Complete By  Expires   Change dressing (specify)  As directed    Comments:     Dressing change: 2 times per day using gauze.  May apply neosporin around G tube.   Diet - low sodium heart healthy  As directed    Increase activity slowly  As directed        Medication List         clopidogrel 75 MG tablet  Commonly known as:  PLAVIX  Take 75 mg by mouth daily after breakfast.     lidocaine-prilocaine cream  Commonly known as:  EMLA  Apply 1 application topically daily as needed (For port-a-cath.).     oxyCODONE-acetaminophen 5-325 MG per tablet  Commonly known as:  PERCOCET/ROXICET  Take 1-2 tablets by mouth every 4 (four) hours as needed for moderate pain.     PRESCRIPTION MEDICATION  Place 1 application into both eyes at bedtime. Sochlor (Sodium Chloride) 5% Ointment     PREVIDENT 5000 PLUS 1.1 % Crea dental cream  Generic drug:  sodium fluoride  Place 1 application onto teeth at bedtime. Apply fluoride to toothbrush. Brush for 2 minutes. Spit out excess. Do not rinse. Repeat nightly.     propranolol 20 MG tablet  Commonly known as:  INDERAL  Take 20 mg by mouth daily after breakfast.     simvastatin 20 MG tablet  Commonly known as:  ZOCOR  Take 20 mg by mouth daily after supper.     sodium chloride 5 % ophthalmic solution  Commonly known as:  MURO 128  Place 1 drop into both eyes every morning.         SignedValarie Merino 04/06/2013, 6:33 AM

## 2013-04-06 NOTE — Progress Notes (Signed)
Assessment unchanged. Denies pain. Script x 1 given as provided by MD. Wife and pt verbalized understanding of dc instructions as well as intro to My Chart through teach back. Pt familiar with My Chart and proactively wants to sign up soon. Wife taught G-tube care ie., how to change dressing as Dr. Derrell Lolling instructed and how to irrigate tube as Dr. Michaell Cowing instructed with verbalized and demonstrating understanding. Pt and wife has teaching material at home regarding G-tube care and further educational print outs provided. Pt discharged via wc to front entrance to meet awaiting vehicle to carry home. Accompanied by NT and wife.

## 2013-04-08 ENCOUNTER — Ambulatory Visit
Admission: RE | Admit: 2013-04-08 | Discharge: 2013-04-08 | Disposition: A | Discharge status: Home / Self Care | Payer: Medicare Other | Source: Ambulatory Visit | Attending: Radiation Oncology | Admitting: Radiation Oncology

## 2013-04-08 ENCOUNTER — Encounter (HOSPITAL_COMMUNITY): Payer: Self-pay | Admitting: General Surgery

## 2013-04-09 ENCOUNTER — Telehealth: Payer: Self-pay | Admitting: Hematology and Oncology

## 2013-04-09 ENCOUNTER — Ambulatory Visit (HOSPITAL_BASED_OUTPATIENT_CLINIC_OR_DEPARTMENT_OTHER): Payer: Medicare Other | Admitting: Hematology and Oncology

## 2013-04-09 ENCOUNTER — Encounter: Payer: Self-pay | Admitting: Radiation Oncology

## 2013-04-09 ENCOUNTER — Other Ambulatory Visit (HOSPITAL_BASED_OUTPATIENT_CLINIC_OR_DEPARTMENT_OTHER): Payer: Medicare Other | Admitting: Lab

## 2013-04-09 ENCOUNTER — Ambulatory Visit
Admission: RE | Admit: 2013-04-09 | Discharge: 2013-04-09 | Disposition: A | Discharge status: Home / Self Care | Payer: Medicare Other | Source: Ambulatory Visit | Attending: Radiation Oncology | Admitting: Radiation Oncology

## 2013-04-09 ENCOUNTER — Ambulatory Visit: Admission: RE | Admit: 2013-04-09 | Payer: Medicare Other | Source: Ambulatory Visit | Admitting: Radiation Oncology

## 2013-04-09 ENCOUNTER — Inpatient Hospital Stay
Admission: RE | Admit: 2013-04-09 | Discharge: 2013-04-09 | Disposition: A | Discharge status: Home / Self Care | Payer: Self-pay | Source: Ambulatory Visit | Attending: Radiation Oncology | Admitting: Radiation Oncology

## 2013-04-09 ENCOUNTER — Encounter: Payer: Self-pay | Admitting: *Deleted

## 2013-04-09 ENCOUNTER — Ambulatory Visit: Payer: Self-pay | Admitting: Hematology and Oncology

## 2013-04-09 VITALS — BP 108/70 | HR 66 | Temp 98.1°F | Ht 69.0 in | Wt 143.8 lb

## 2013-04-09 VITALS — BP 116/70 | HR 72 | Temp 97.5°F | Resp 18 | Ht 69.0 in | Wt 143.5 lb

## 2013-04-09 DIAGNOSIS — R634 Abnormal weight loss: Secondary | ICD-10-CM

## 2013-04-09 DIAGNOSIS — C100 Malignant neoplasm of vallecula: Secondary | ICD-10-CM

## 2013-04-09 LAB — CBC WITH DIFFERENTIAL/PLATELET
Basophils Absolute: 0 10*3/uL (ref 0.0–0.1)
Eosinophils Absolute: 0.1 10*3/uL (ref 0.0–0.5)
HCT: 41 % (ref 38.4–49.9)
HGB: 13.6 g/dL (ref 13.0–17.1)
MCV: 97.8 fL (ref 79.3–98.0)
MONO#: 0.9 10*3/uL (ref 0.1–0.9)
MONO%: 12.5 % (ref 0.0–14.0)
NEUT#: 5.1 10*3/uL (ref 1.5–6.5)
NEUT%: 68 % (ref 39.0–75.0)
RBC: 4.19 10*6/uL — ABNORMAL LOW (ref 4.20–5.82)
RDW: 13.6 % (ref 11.0–14.6)

## 2013-04-09 LAB — COMPREHENSIVE METABOLIC PANEL (CC13)
Albumin: 3.2 g/dL — ABNORMAL LOW (ref 3.5–5.0)
Alkaline Phosphatase: 54 U/L (ref 40–150)
BUN: 13.9 mg/dL (ref 7.0–26.0)
CO2: 26 mEq/L (ref 22–29)
Calcium: 9.6 mg/dL (ref 8.4–10.4)
Chloride: 101 mEq/L (ref 98–109)
Glucose: 185 mg/dl — ABNORMAL HIGH (ref 70–140)
Potassium: 4.1 mEq/L (ref 3.5–5.1)

## 2013-04-09 LAB — MAGNESIUM (CC13): Magnesium: 2.1 mg/dl (ref 1.5–2.5)

## 2013-04-09 NOTE — Telephone Encounter (Signed)
gv and printed appt sched and avs for pt for NOV °

## 2013-04-09 NOTE — Progress Notes (Signed)
Weekly Management Note Current Dose:10 Gy  Projected Dose:70 Gy   Narrative:  The patient presents for routine under treatment assessment.  CBCT/MVCT images/Port film x-rays were reviewed.  The chart was checked. Doing well. Rash from Erbitux. Given cream by Dr. Bertis Ruddy.   Physical Findings:  Rash over face and neck. No mucocitis in oropharynx. Moist oropharynx.   Vitals:  Filed Vitals:   04/09/13 0949  BP: 108/70  Pulse: 66  Temp: 98.1 F (36.7 C)   Weight:  Wt Readings from Last 3 Encounters:  04/09/13 143 lb 12.8 oz (65.227 kg)  04/09/13 143 lb 8 oz (65.091 kg)  04/05/13 145 lb (65.772 kg)   Lab Results  Component Value Date   WBC 7.5 04/09/2013   HGB 13.6 04/09/2013   HCT 41.0 04/09/2013   MCV 97.8 04/09/2013   PLT 175 04/09/2013   Lab Results  Component Value Date   CREATININE 0.9 04/09/2013   BUN 13.9 04/09/2013   NA 136 04/09/2013   K 4.1 04/09/2013   CL 98 04/02/2013   CO2 26 04/09/2013     Impression:  The patient is tolerating radiation.  Plan:  Continue treatment as planned. Add biafene after RT treatments. No cream prior to treatment.

## 2013-04-09 NOTE — Progress Notes (Addendum)
Derrick [name] has received 5 fractions to the valleculla.  He denies any pain nor discomfort presently.  He has had 1 course of Erbitux and has a rash on his face, neck and upper chest.  Peg tube intact and he is cleansing it twice daily.  No other voiced concerns.  Education today regarding radiation to the neck region inclusive of fatigue, sore throat(esophagitis- pain upon swallowing), redness, tanning, dryness and/or peeling of skin in the treatment field, and dry mouth.  Additionally he is receiving Erbitux and he may have more tenderness of his kin since the "Erbitux rash" has started.  Given Radiation Therapy and You booklet with the appropriate pages marked.  Given Biafine lotion to use in the treatment field.  He is accompanied by his spouse today.  Made sure to speak loudly since he is [facility] and made sure he understood the information.  Teach back method.

## 2013-04-09 NOTE — Anesthesia Postprocedure Evaluation (Signed)
  Anesthesia Post-op Note  Patient: Derrick [name]  Procedure(s) Performed: Procedure(s) (LRB): INSERTION PORT-A-CATH (Right) GASTROSTOMY TUBE (Left)  Patient Location: PACU  Anesthesia Type: General  Level of Consciousness: awake and alert   Airway and Oxygen Therapy: Patient Spontanous Breathing  Post-op Pain: mild  Post-op Assessment: Post-op Vital signs reviewed, Patient's Cardiovascular Status Stable, Respiratory Function Stable, Patent Airway and No signs of Nausea or vomiting  Last Vitals:  Filed Vitals:   04/06/13 1015  BP: 118/64  Pulse: 70  Temp:   Resp:     Post-op Vital Signs: stable   Complications: No apparent anesthesia complications

## 2013-04-09 NOTE — Addendum Note (Signed)
Encounter addended by: Delynn Flavin, RN on: 04/09/2013 12:12 PM<BR>     Documentation filed: Notes Section, Inpatient Patient Education, Inpatient Document Flowsheet, Demographics Visit

## 2013-04-09 NOTE — Progress Notes (Signed)
[facility] OFFICE PROGRESS NOTE  Patient Care Team: [name], MD as PCP - General (Family Medicine) Dennis Bast, RN as Registered Nurse (Oncology)  DIAGNOSIS: Squamous cell carcinoma of the vallecula  SUMMARY OF ONCOLOGIC HISTORY: Oncology History   Cancer of vallecula   Primary site: Pharynx - Oropharynx   Staging method: AJCC 7th Edition   Clinical: Stage IVA (T2, N2c, M0) signed by Lonie Peak, MD on 03/15/2013  5:35 PM   Summary: Stage IVA (T2, N2c, M0)      Cancer of vallecula   02/22/2013 Imaging Ct scan of neck showed abnormal lymphadenopathy   03/08/2013 Surgery Laryngoscopy and biopsy confirmed SCC of vallecula   03/20/2013 Imaging PET/CT scan confirmed cancer at the base of tongue and regional lympadenopathy   04/03/2013 -  Radiation Therapy Start of XRT   04/03/2013 -  Chemotherapy cycle 1 of weekly Erbitux   04/05/2013 Procedure Placement of infusaport & feeding tube    INTERVAL HISTORY: Derrick [name] 79 y.o. male returns for further followup. He received cycle 1 of chemotherapy last week. He denies any recent fever, chills, night sweats or abnormal weight loss  denies any dry mouth. The patient has reduced appetite. I have reviewed the past medical history, past surgical history, social history and family history with the patient and they are unchanged from previous note.  ALLERGIES:  is allergic to aspirin.  MEDICATIONS:  Current Outpatient Prescriptions  Medication Sig Dispense Refill  . clopidogrel (PLAVIX) 75 MG tablet Take 75 mg by mouth daily after breakfast.      . lidocaine-prilocaine (EMLA) cream Apply 1 application topically daily as needed (For port-a-cath.).      Marland Kitchen oxyCODONE-acetaminophen (PERCOCET/ROXICET) 5-325 MG per tablet Take 1-2 tablets by mouth every 4 (four) hours as needed for moderate pain.  30 tablet  0  . PRESCRIPTION MEDICATION Place 1 application into both eyes at bedtime. Sochlor (Sodium Chloride) 5%  Ointment      . propranolol (INDERAL) 20 MG tablet Take 20 mg by mouth daily after breakfast.      . simvastatin (ZOCOR) 20 MG tablet Take 20 mg by mouth daily after supper.      . sodium chloride (MURO 128) 5 % ophthalmic solution Place 1 drop into both eyes every morning.      . sodium fluoride (PREVIDENT 5000 PLUS) 1.1 % CREA dental cream Place 1 application onto teeth at bedtime. Apply fluoride to toothbrush. Brush for 2 minutes. Spit out excess. Do not rinse. Repeat nightly.       No current facility-administered medications for this visit.    REVIEW OF SYSTEMS:   Constitutional: Denies fevers, chills or abnormal weight loss Eyes: Denies blurriness of vision Ears, nose, mouth, throat, and face: Denies mucositis or sore throat Respiratory: Denies cough, dyspnea or wheezes Cardiovascular: Denies palpitation, chest discomfort or lower extremity swelling Gastrointestinal:  Denies nausea, heartburn or change in bowel habits Skin: Denies abnormal skin rashes Lymphatics: Denies new lymphadenopathy or easy bruising Neurological:Denies numbness, tingling or new weaknesses Behavioral/Psych: Mood is stable, no new changes  All other systems were reviewed with the patient and are negative.  PHYSICAL EXAMINATION: ECOG PERFORMANCE STATUS: 1 - Symptomatic but completely ambulatory  Filed Vitals:   04/09/13 0913  BP: 116/70  Pulse: 72  Temp: 97.5 F (36.4 C)  Resp: 18   Filed Weights   04/09/13 0913  Weight: 143 lb 8 oz (65.091 kg)    GENERAL:alert, no distress and comfortable  SKIN: skin color, texture, turgor are normal, no rashes or significant lesions EYES: normal, Conjunctiva are pink and non-injected, sclera clear OROPHARYNX:no exudate, no erythema and lips, buccal mucosa, and tongue normal  NECK: supple, thyroid normal size, non-tender, without nodularity LYMPH:  no palpable lymphadenopathy in the cervical, axillary or inguinal LUNGS: clear to auscultation and percussion with  normal breathing effort HEART: regular rate & rhythm and no murmurs and no lower extremity edema ABDOMEN:abdomen soft, non-tender and normal bowel sounds. Feeding tube site looks okay  Musculoskeletal:no cyanosis of digits and no clubbing . Port site looks okay  NEURO: alert & oriented x 3 with fluent speech, no focal motor/sensory deficits  LABORATORY DATA:  I have reviewed the data as listed    Component Value Date/Time   NA 136 04/09/2013 0804   NA 136 04/02/2013 1445   K 4.1 04/09/2013 0804   K 4.9 04/02/2013 1445   CL 98 04/02/2013 1445   CO2 26 04/09/2013 0804   CO2 30 04/02/2013 1445   GLUCOSE 185* 04/09/2013 0804   GLUCOSE 91 04/02/2013 1445   BUN 13.9 04/09/2013 0804   BUN 17 04/02/2013 1445   CREATININE 0.9 04/09/2013 0804   CREATININE 1.08 04/02/2013 1445   CREATININE 1.01 02/08/2013 0951   CALCIUM 9.6 04/09/2013 0804   CALCIUM 10.2 04/02/2013 1445   PROT 7.0 04/09/2013 0804   PROT 7.9 04/02/2013 1445   ALBUMIN 3.2* 04/09/2013 0804   ALBUMIN 3.8 04/02/2013 1445   AST 20 04/09/2013 0804   AST 24 04/02/2013 1445   ALT 14 04/09/2013 0804   ALT 12 04/02/2013 1445   ALKPHOS 54 04/09/2013 0804   ALKPHOS 57 04/02/2013 1445   BILITOT 0.93 04/09/2013 0804   BILITOT 0.5 04/02/2013 1445   GFRNONAA 59* 04/02/2013 1445   GFRAA 69* 04/02/2013 1445    No results found for this basename: SPEP, UPEP,  kappa and lambda light chains    Lab Results  Component Value Date   WBC 7.5 04/09/2013   NEUTROABS 5.1 04/09/2013   HGB 13.6 04/09/2013   HCT 41.0 04/09/2013   MCV 97.8 04/09/2013   PLT 175 04/09/2013      Chemistry      Component Value Date/Time   NA 136 04/09/2013 0804   NA 136 04/02/2013 1445   K 4.1 04/09/2013 0804   K 4.9 04/02/2013 1445   CL 98 04/02/2013 1445   CO2 26 04/09/2013 0804   CO2 30 04/02/2013 1445   BUN 13.9 04/09/2013 0804   BUN 17 04/02/2013 1445   CREATININE 0.9 04/09/2013 0804   CREATININE 1.08 04/02/2013 1445   CREATININE 1.01 02/08/2013 0951       Component Value Date/Time   CALCIUM 9.6 04/09/2013 0804   CALCIUM 10.2 04/02/2013 1445   ALKPHOS 54 04/09/2013 0804   ALKPHOS 57 04/02/2013 1445   AST 20 04/09/2013 0804   AST 24 04/02/2013 1445   ALT 14 04/09/2013 0804   ALT 12 04/02/2013 1445   BILITOT 0.93 04/09/2013 0804   BILITOT 0.5 04/02/2013 1445     ASSESSMENT & PLAN:  #1 squamous cell carcinoma the vallecula We'll proceed with chemotherapy as scheduled this week. You very minor side effects. #2 weight loss And concern about this. His appointment to see the nutritionist this week. I will continue keeping an eye on the patient. He need to start using his feeding tube for nutritional purposes.   No orders of the defined types were placed in this encounter.  All questions were answered. The patient knows to call the clinic with any problems, questions or concerns. No barriers to learning was detected. I spent 15 minutes counseling the patient face to face. The total time spent in the appointment was 20 minutes and more than 50% was on counseling and review of test results     [facility], Aryannah Mohon, MD 04/09/2013 9:36 AM

## 2013-04-10 ENCOUNTER — Ambulatory Visit: Payer: Medicare Other | Admitting: Nutrition

## 2013-04-10 ENCOUNTER — Ambulatory Visit (HOSPITAL_BASED_OUTPATIENT_CLINIC_OR_DEPARTMENT_OTHER): Payer: Medicare Other

## 2013-04-10 ENCOUNTER — Ambulatory Visit
Admission: RE | Admit: 2013-04-10 | Discharge: 2013-04-10 | Disposition: A | Discharge status: Home / Self Care | Payer: Medicare Other | Source: Ambulatory Visit | Attending: Radiation Oncology | Admitting: Radiation Oncology

## 2013-04-10 ENCOUNTER — Encounter: Payer: Self-pay | Admitting: *Deleted

## 2013-04-10 VITALS — BP 126/70 | HR 75 | Temp 98.2°F | Resp 18

## 2013-04-10 DIAGNOSIS — C100 Malignant neoplasm of vallecula: Secondary | ICD-10-CM

## 2013-04-10 DIAGNOSIS — Z5112 Encounter for antineoplastic immunotherapy: Secondary | ICD-10-CM

## 2013-04-10 MED ORDER — CETUXIMAB CHEMO IV INJECTION 200 MG/100ML
250.0000 mg/m2 | Freq: Once | INTRAVENOUS | Status: AC
  Administered 2013-04-10: 500 mg via INTRAVENOUS
  Filled 2013-04-10: qty 250

## 2013-04-10 MED ORDER — HEPARIN SOD (PORK) LOCK FLUSH 100 UNIT/ML IV SOLN
500.0000 [IU] | Freq: Once | INTRAVENOUS | Status: AC | PRN
  Administered 2013-04-10: 500 [IU]
  Filled 2013-04-10: qty 5

## 2013-04-10 MED ORDER — SODIUM CHLORIDE 0.9 % IV SOLN
Freq: Once | INTRAVENOUS | Status: AC
  Administered 2013-04-10: 10:00:00 via INTRAVENOUS

## 2013-04-10 MED ORDER — LIDOCAINE-PRILOCAINE 2.5-2.5 % EX CREA
TOPICAL_CREAM | CUTANEOUS | Status: AC
  Filled 2013-04-10: qty 5

## 2013-04-10 MED ORDER — SODIUM CHLORIDE 0.9 % IJ SOLN
10.0000 mL | INTRAMUSCULAR | Status: DC | PRN
  Administered 2013-04-10: 10 mL
  Filled 2013-04-10: qty 10

## 2013-04-10 MED ORDER — OSMOLITE 1.2 CAL PO LIQD: ORAL | Status: DC

## 2013-04-10 MED ORDER — DIPHENHYDRAMINE HCL 50 MG/ML IJ SOLN
INTRAMUSCULAR | Status: AC
  Filled 2013-04-10: qty 1

## 2013-04-10 MED ORDER — DIPHENHYDRAMINE HCL 50 MG/ML IJ SOLN
50.0000 mg | Freq: Once | INTRAMUSCULAR | Status: AC
  Administered 2013-04-10: 50 mg via INTRAVENOUS

## 2013-04-10 NOTE — Progress Notes (Signed)
Met patient and his wife while he was receiving infusion.  Assisted [facility], Nutritionist, in instructing wife how to conduct bolus flushes and feedings, using Teach Back.  She indicated understanding and successful demonstration.  Continuing to navigate as L2 (treatments established) patient.  Young Berry, RN, BSN, [facility] Head & Neck Oncology Navigator [phone]

## 2013-04-10 NOTE — Progress Notes (Signed)
Patient continues to eat 3 meals plus snacks.  He has a poor appetite and taste alterations.  Weight decreased to 143.8 pounds from 145.13 pounds November 4.  Patient agreeable to beginning bolus feedings via feeding tube to supplement oral intake.  Nutrition Diagnosis:  Food and Nutrition Related Knowledge Deficit continues. Nutrition Diagnosis:  Inadequate oral intake related to poor appetite and taste alterations as evidenced by 8% weight loss from Usual body weight.  Intervention:  Begin one can Osmolite 1.2 with 60 ml free water before and after each bolus feeding TID between meals.  Continue to eat meals and snacks as tolerated. Patient and wife educated on giving bolus tube feeding.  Wife demonstrated competency in delivery of bolus feeding.  Tube feeding orders written and [facility] notified.  Teach back method used.  Monitoring, Evaluation, and Goals:  Patient will tolerate bolus feedings and oral intake to minimize further weight loss.  Next Visit: Wednesday, November 19 during chemotherapy.

## 2013-04-10 NOTE — Patient Instructions (Signed)
[facility] Discharge Instructions for Patients Receiving Chemotherapy  Today you received the following chemotherapy agents Erbitux  To help prevent nausea and vomiting after your treatment, we encourage you to take your nausea medication as prescribed.   If you develop nausea and vomiting that is not controlled by your nausea medication, call the clinic.   BELOW ARE SYMPTOMS THAT SHOULD BE REPORTED IMMEDIATELY:  *FEVER GREATER THAN 100.5 F  *CHILLS WITH OR WITHOUT FEVER  NAUSEA AND VOMITING THAT IS NOT CONTROLLED WITH YOUR NAUSEA MEDICATION  *UNUSUAL SHORTNESS OF BREATH  *UNUSUAL BRUISING OR BLEEDING  TENDERNESS IN MOUTH AND THROAT WITH OR WITHOUT PRESENCE OF ULCERS  *URINARY PROBLEMS  *BOWEL PROBLEMS  UNUSUAL RASH Items with * indicate a potential emergency and should be followed up as soon as possible.  Feel free to call the clinic you have any questions or concerns. The clinic phone number is [phone].    

## 2013-04-10 NOTE — Progress Notes (Signed)
Met with patient during scheduled RT and appt with Dr. Bertis Ruddy to provide support and continuity of care.  Beginning to navigate as L2 (treatments established) patient.  Young Berry, RN, BSN, [facility] Head & Neck Oncology Navigator [phone]

## 2013-04-11 ENCOUNTER — Ambulatory Visit
Admission: RE | Admit: 2013-04-11 | Discharge: 2013-04-11 | Disposition: A | Discharge status: Home / Self Care | Payer: Medicare Other | Source: Ambulatory Visit | Attending: Radiation Oncology | Admitting: Radiation Oncology

## 2013-04-12 ENCOUNTER — Ambulatory Visit
Admission: RE | Admit: 2013-04-12 | Discharge: 2013-04-12 | Disposition: A | Discharge status: Home / Self Care | Payer: Medicare Other | Source: Ambulatory Visit | Attending: Radiation Oncology | Admitting: Radiation Oncology

## 2013-04-12 ENCOUNTER — Telehealth (INDEPENDENT_AMBULATORY_CARE_PROVIDER_SITE_OTHER): Payer: Self-pay | Admitting: *Deleted

## 2013-04-12 ENCOUNTER — Telehealth: Payer: Self-pay | Admitting: *Deleted

## 2013-04-12 NOTE — Telephone Encounter (Signed)
Called pt to provide follow-up to his VM re: a follow-up appt with Dr. Derrell Lolling s/p PEG/PAC placement.  Spoke with his wife to see how she is doing with tube feedings and she said that she feels more confident and comfortable with the procedure.  She expressed concern that Kregg is not meeting his caloric needs though he continues to eat/drink despite an increasingly sore throat.  I noted that they have an appt with Vernell Leep next Wednesday and that will be a good time to update her on his nutritional intake and that she in turn will provide additional guidance to meet his nutritional needs.  She indicated understanding.  Young Berry, RN, BSN, [facility] Head & Neck Oncology Navigator [phone]

## 2013-04-12 NOTE — Telephone Encounter (Signed)
There are no sutures to be removed.  Please notify pt.

## 2013-04-12 NOTE — Telephone Encounter (Signed)
I called and spoke with patient.  He has no complaints and is doing great since having his peg tube and PAC placed.  He states that his wife is giving him the nutrition through his tube and everything is fine.  I instructed him to call our office if he has any questions or concerns and an appt with Dr. Derrell Lolling can be made.  He did state that he thought Dr. Derrell Lolling mentioned a suture near the peg tube was going to be removed, but he isn't sure when.  I informed him that I would clarify and give him a call back.  Pt agreeable with plan of care.

## 2013-04-12 NOTE — Telephone Encounter (Signed)
Notified pt that no sutures need to be removed.  He is agreeable with this plan and knows to call our office if he has any problems.

## 2013-04-15 ENCOUNTER — Ambulatory Visit (HOSPITAL_COMMUNITY): Payer: Self-pay | Admitting: Dentistry

## 2013-04-15 ENCOUNTER — Ambulatory Visit
Admission: RE | Admit: 2013-04-15 | Discharge: 2013-04-15 | Disposition: A | Discharge status: Home / Self Care | Payer: Medicare Other | Source: Ambulatory Visit | Attending: Radiation Oncology | Admitting: Radiation Oncology

## 2013-04-15 ENCOUNTER — Telehealth: Payer: Self-pay | Admitting: *Deleted

## 2013-04-15 ENCOUNTER — Encounter (HOSPITAL_COMMUNITY): Payer: Self-pay | Admitting: Dentistry

## 2013-04-15 VITALS — BP 119/78 | HR 76 | Temp 99.1°F | Wt 138.0 lb

## 2013-04-15 DIAGNOSIS — C100 Malignant neoplasm of vallecula: Secondary | ICD-10-CM

## 2013-04-15 DIAGNOSIS — R1310 Dysphagia, unspecified: Secondary | ICD-10-CM

## 2013-04-15 DIAGNOSIS — R432 Parageusia: Secondary | ICD-10-CM

## 2013-04-15 DIAGNOSIS — Z09 Encounter for follow-up examination after completed treatment for conditions other than malignant neoplasm: Secondary | ICD-10-CM

## 2013-04-15 DIAGNOSIS — Z0189 Encounter for other specified special examinations: Secondary | ICD-10-CM

## 2013-04-15 DIAGNOSIS — R634 Abnormal weight loss: Secondary | ICD-10-CM

## 2013-04-15 NOTE — Telephone Encounter (Signed)
Rec'd call from pt wife who indicated that on Sunday pt vomited shortly after eating a small amount of eggs/bacon at breakfast; has not been eating since b/c he doesn't want to have a repeat episode.  I suggested he take an anti-nausea med prior to meals but he has none prescribed.  He is complaining of increasingly sore throat and loss of taste.  He experiences no nausea with tube feedings and wife continues with administrations as previously directed.  I indicated I wd relay information to Dr. Bertis Ruddy and Vernell Leep so they can address at tomorrow's appts.  Wife indicated comfort with this plan.  She also expressed that pt had a notable change in mood, becoming somewhat depressed as a result of emesis episode.  She expressed interest in identifying a counselor for him.  I indicated I would contact Kathrin Penner, LCSW, for her assistance.  Wife expressed appreciation.  I spoke with Lauren by phone; she indicated she would follow-up tomorrow.  Young Berry, RN, BSN, [facility] Head & Neck Oncology Navigator [phone]

## 2013-04-15 NOTE — Progress Notes (Signed)
04/15/2013  Patient:            Derrick [name] Date of Birth:  17-Jul-1922 MRN:                [id number]  BP 119/78  Pulse 76  Temp(Src) 99.1 F (37.3 C) (Oral)  Wt 138 lb (62.596 kg)  [name] presents for periodic oral examination during radiation therapy. Patient has completed 11/35 radiation treatments and two chemotherapy treatments. Patient's last treatment is 05/22/13.  REVIEW OF CHIEF COMPLAINTS:  DRY MOUTH: Yes. Mild xerostomia. HARD TO SWALLOW: Yes  HURT TO SWALLOW: Yes TASTE CHANGES: Significant taste changes make it difficult to eat secondary to decreased appetite. SORES IN MOUTH: No TRISMUS: No problems with trismus. WEIGHT: He has lost some weight.  HOME OH REGIMEN:  BRUSHING: Twice a day FLOSSING: He is using Stimudents once a day. RINSING: Using Biotene rinses. FLUORIDE: Using PreviDent 5000 at bedtime. Patient also is using fluoride trays fabricated by Dr. Jeanie Sewer. I am unsure of the type of fluoride he is using in the trays. TRISMUS EXERCISES:  Maximum interincisal opening: 38mm   DENTAL EXAM:  Oral Hygiene:(PLAQUE): Plaque noted. Oral hygiene improvement was suggested. Use of electric tooth brush, either the Oral B Braun or Sonicare electric tooth was recommended. LOCATION OF MUCOSITIS: Back of throat and bilateral tongue. DESCRIPTION OF SALIVA: Decreased saliva. Incipient xerostomia. ANY EXPOSED BONE: None noted OTHER WATCHED AREAS: Areas of generalized periodontal disease. DX: Xerostomia, Dysgeusia, Dysphagia, Odynophagia, Weight Loss, Accretions and Mucositis  RECOMMENDATIONS: 1. Brush after meals and at bedtime.  Use fluoride at bedtime. Recall the name of the fluoride prescribed by Dr. Jeanie Sewer for use of fluoride trays. Consider prescribing FluoroSHIELD instead. 2. Use trismus exercises as directed. 3. Use Biotene Rinse or salt water/baking soda rinses. 4. Multiple sips of water as needed. 5. Return to clinic in two months for periodic  oral exam after radiation therapy. Call if problems before then.  Charlynne Pander, DDS

## 2013-04-15 NOTE — Patient Instructions (Addendum)
  RECOMMENDATIONS: 1. Brush after meals and at bedtime.  Use fluoride at bedtime. Recall the name of the fluoride prescribed by Dr. Jeanie Sewer for use of fluoride trays. Consider prescribing FluoroSHIELD instead. 2. Use trismus exercises as directed. 3. Use Biotene Rinse or salt water/baking soda rinses. 4. Multiple sips of water as needed. 5. Return to clinic in two months for periodic oral exam after radiation therapy. Call if problems before then. Dr. Kristin Bruins  TRISMUS  Trismus is a condition where the jaw does not allow the mouth to open as wide as it usually does.  This can happen almost suddenly, or in other cases the process is so slow, it is hard to notice it-until it is too far along.  When the jaw joints and/or muscles have been exposed to radiation treatments, the onset of Trismus is very slow.  This is because the muscles are losing their stretching ability over a long period of time, as long as 2 YEARS after the end of radiation.  It is therefore important to exercise these muscles and joints.  TRISMUS EXERCISES   Stack of tongue depressors measuring the same or a little less than the last documented MIO (Maximum Interincisal Opening).  Secure them with a rubber band on both ends.  Place the stack in the patient's mouth, supporting the other end.  Allow 30 seconds for muscle stretching.  Rest for a few seconds.  Repeat 3-5 times  For all radiation patients, this exercise is recommended in the mornings and evenings unless otherwise instructed.  The exercise should be done for a period of 2 YEARS after the end of radiation.  MIO should be checked routinely on recall dental visits by the general dentist or the hospital dentist.  The patient is advised to report any changes, soreness, or difficulties encountered when doing the exercises.  FLUORIDE TRAYS PATIENT INSTRUCTIONS    Obtain prescription from the pharmacy.  Don't be surprised if it needs to be ordered.  Be sure  to let the pharmacy know when you are close to needing a new refill for them to have it ready for you without interruption of Fluoride use.  The best time to use your Fluoride is before bed time.  You must brush your teeth very well and floss before using the Fluoride in order to get the best use out of the Fluoride treatments.  Place 1 drop of Fluoride gel per tooth in the tray.  Place the tray on your lower teeth and your upper teeth.  Make sure the trays are seated all the way.  Remember, they only fit one way on your teeth.  Insert for 5 full minutes.  At the end of the 5 minutes, take the trays out.  SPIT OUT excess. .  Do NOT rinse your mouth!  Do NOT eat or drink after treatments for at least 30 minutes.  This is why the best time for your treatments is before bedtime.  Clean the inside of your Fluoride trays using COLD WATER and a toothbrush.  In order to keep your Trays from discoloring and free from odors, soak them overnight in denture cleaners such as Efferdent.  Do not use bleach or non denture products.  Store the trays in a safe dry place AWAY from any heat until your next treatment.  If anything happens to your Fluoride trays, or they don't fit as well after any dental work, please let us know as soon as possible.

## 2013-04-16 ENCOUNTER — Ambulatory Visit
Admission: RE | Admit: 2013-04-16 | Discharge: 2013-04-16 | Disposition: A | Discharge status: Home / Self Care | Payer: Medicare Other | Source: Ambulatory Visit | Attending: Radiation Oncology | Admitting: Radiation Oncology

## 2013-04-16 ENCOUNTER — Encounter: Payer: Self-pay | Admitting: *Deleted

## 2013-04-16 ENCOUNTER — Encounter: Payer: Self-pay | Admitting: Radiation Oncology

## 2013-04-16 ENCOUNTER — Telehealth: Payer: Self-pay | Admitting: Hematology and Oncology

## 2013-04-16 ENCOUNTER — Other Ambulatory Visit: Payer: Self-pay | Admitting: Lab

## 2013-04-16 ENCOUNTER — Encounter: Payer: Self-pay | Admitting: Hematology and Oncology

## 2013-04-16 ENCOUNTER — Ambulatory Visit (HOSPITAL_BASED_OUTPATIENT_CLINIC_OR_DEPARTMENT_OTHER): Payer: Medicare Other | Admitting: Hematology and Oncology

## 2013-04-16 VITALS — BP 122/71 | HR 73 | Temp 99.2°F | Resp 18 | Ht 69.0 in | Wt 141.0 lb

## 2013-04-16 VITALS — Temp 99.0°F | Wt 141.2 lb

## 2013-04-16 DIAGNOSIS — K121 Other forms of stomatitis: Secondary | ICD-10-CM

## 2013-04-16 DIAGNOSIS — C100 Malignant neoplasm of vallecula: Secondary | ICD-10-CM

## 2013-04-16 DIAGNOSIS — R21 Rash and other nonspecific skin eruption: Secondary | ICD-10-CM

## 2013-04-16 DIAGNOSIS — R112 Nausea with vomiting, unspecified: Secondary | ICD-10-CM

## 2013-04-16 DIAGNOSIS — R634 Abnormal weight loss: Secondary | ICD-10-CM

## 2013-04-16 HISTORY — DX: Nausea with vomiting, unspecified: R11.2

## 2013-04-16 HISTORY — DX: Rash and other nonspecific skin eruption: R21

## 2013-04-16 MED ORDER — CLINDAMYCIN PHOSPHATE 1 % EX GEL: Freq: Two times a day (BID) | CUTANEOUS | Status: AC

## 2013-04-16 MED ORDER — ONDANSETRON HCL 8 MG PO TABS: 8.0000 mg | ORAL_TABLET | Freq: Three times a day (TID) | ORAL | Status: AC | PRN

## 2013-04-16 MED ORDER — PROMETHAZINE HCL 25 MG RE SUPP: 25.0000 mg | Freq: Two times a day (BID) | RECTAL | Status: AC | PRN

## 2013-04-16 MED ORDER — PROMETHAZINE HCL 25 MG PO TABS: 25.0000 mg | ORAL_TABLET | Freq: Four times a day (QID) | ORAL | Status: AC | PRN

## 2013-04-16 NOTE — Progress Notes (Signed)
Derrick [name] Derrick [name] has completed 10 fractions to his bilateral neck.  He was seen by Dr. Bertis Ruddy this am, and he will not have chemotherapy this week, nor any labs today.  Rash from Erbitux evident and he will start using Clindamycin Gel today as ordered by Dr. Bertis Ruddy.  He states he is "repulsed by food", can't taste anything, and has a sore throat.   He has a PEG tube and instills 3 cans of Osmolite 1.2 vis the peg daily.  He has lost 4 lbs since last weight.  He refused to have vitals taken again since he just left Dr. Maxine Glenn office.  He has a low grade fever.  Note mucositis in the soft palate region.  Oral mucosa is moist.

## 2013-04-16 NOTE — Progress Notes (Signed)
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  SUMMARY OF ONCOLOGIC HISTORY: Oncology History   Cancer of vallecula   Primary site: Pharynx - Oropharynx   Staging method: AJCC 7th Edition   Clinical: Stage IVA (T2, N2c, M0) signed by Lonie Peak, MD on 03/15/2013  5:35 PM   Summary: Stage IVA (T2, N2c, M0)      Cancer of vallecula   02/22/2013 Imaging Ct scan of neck showed abnormal lymphadenopathy   03/08/2013 Surgery Laryngoscopy and biopsy confirmed SCC of vallecula   03/20/2013 Imaging PET/CT scan confirmed cancer at the base of tongue and regional lympadenopathy   04/03/2013 -  Radiation Therapy Start of XRT   04/03/2013 -  Chemotherapy cycle 1 of weekly Erbitux   04/05/2013 Procedure Placement of infusaport & feeding tube    INTERVAL HISTORY: GARLIN BATDORF 79 y.o. male returns for followup. He experienced a lot of side effects over the past week. He has uncontrolled nausea and vomiting. He has lost some weight. He still able to swallow some food but due to altered taste sensation did not feel like he wants to eat or drink. He is using 3 cans of nutritional supplements per day. The patient also developed profuse rash around his scope his face and his chest that are intensely itchy. He denies any recent fever, chills, night sweats or abnormal cough.  I have reviewed the past medical history, past surgical history, social history and family history with the patient and they are unchanged from previous note.  ALLERGIES:  is allergic to aspirin.  MEDICATIONS:  Current Outpatient Prescriptions  Medication Sig Dispense Refill  . clopidogrel (PLAVIX) 75 MG tablet Take 75 mg by mouth daily after breakfast.       . lidocaine-prilocaine (EMLA) cream Apply 1 application topically daily as needed (For port-a-cath.).      Marland Kitchen Nutritional Supplements (FEEDING SUPPLEMENT, OSMOLITE 1.2 CAL,) LIQD One can Osmolite 1.2 with 60 ml free water before and after each bolus feeding TID between meals.  711 mL  0  . PRESCRIPTION MEDICATION Place 1 application into both eyes at bedtime. Sochlor (Sodium Chloride) 5% Ointment      . propranolol (INDERAL) 20 MG tablet Take 20 mg by mouth daily after breakfast.      . simvastatin (ZOCOR) 20 MG tablet Take 20 mg by mouth daily after supper.      . sodium chloride (MURO 128) 5 % ophthalmic solution Place 1 drop into both eyes every morning.      . sodium fluoride (PREVIDENT 5000 PLUS) 1.1 % CREA dental cream Place 1 application onto teeth at bedtime. Apply fluoride to toothbrush. Brush for 2 minutes. Spit out excess. Do not rinse. Repeat nightly.      . clindamycin (CLINDAGEL) 1 % gel Apply topically 2 (two) times daily.  60 g  0  . ondansetron (ZOFRAN) 8 MG tablet Take 1 tablet (8 mg total) by mouth every 8 (eight) hours as needed for nausea.  30 tablet  3  . oxyCODONE-acetaminophen (PERCOCET/ROXICET) 5-325 MG per tablet Take 1-2 tablets by mouth every 4 (four) hours as needed for moderate pain.  30 tablet  0  . promethazine (PHENERGAN) 25 MG suppository Place 1 suppository (25 mg total) rectally 2 (two)  times daily as needed for nausea (if unable to swallow oral anti-emetics).  10 suppository  1  . promethazine (PHENERGAN) 25 MG tablet Take 1 tablet (25 mg total) by mouth every 6 (six) hours as needed for nausea.  30 tablet  3   No current facility-administered medications for this visit.    REVIEW OF SYSTEMS:   Eyes: Denies blurriness of vision Respiratory: Denies cough, dyspnea or wheezes Cardiovascular: Denies palpitation, chest discomfort or lower extremity swelling Lymphatics: Denies new lymphadenopathy or easy bruising Neurological:Denies numbness, tingling or new  weaknesses Behavioral/Psych: Mood is stable, no new changes  All other systems were reviewed with the patient and are negative.  PHYSICAL EXAMINATION: ECOG PERFORMANCE STATUS: 1 - Symptomatic but completely ambulatory  Filed Vitals:   04/16/13 0927  BP: 122/71  Pulse: 73  Temp: 99.2 F (37.3 C)  Resp: 18   Filed Weights   04/16/13 0927  Weight: 141 lb (63.957 kg)    GENERAL:alert, no distress and comfortable. SKIN: Significant acneform rash over his face, his chest and his scalp, consistent with chemotherapy induced skin rash EYES: normal, Conjunctiva are pink and non-injected, sclera clear OROPHARYNX:no exudate, significant erythema in his oropharynx without ulceration, no thrush NECK: supple, thyroid normal size, non-tender, without nodularity LYMPH:  no palpable lymphadenopathy in the cervical, axillary or inguinal LUNGS: clear to auscultation and percussion with normal breathing effort HEART: regular rate & rhythm and no murmurs and no lower extremity edema ABDOMEN:abdomen soft, non-tender and normal bowel sounds. Feeding tube site looks okay Musculoskeletal:no cyanosis of digits and no clubbing  NEURO: alert & oriented x 3 with fluent speech, no focal motor/sensory deficits  LABORATORY DATA:  I have reviewed the data as listed    Component Value Date/Time   NA 136 04/09/2013 0804   NA 136 04/02/2013 1445   K 4.1 04/09/2013 0804   K 4.9 04/02/2013 1445   CL 98 04/02/2013 1445   CO2 26 04/09/2013 0804   CO2 30 04/02/2013 1445   GLUCOSE 185* 04/09/2013 0804   GLUCOSE 91 04/02/2013 1445   BUN 13.9 04/09/2013 0804   BUN 17 04/02/2013 1445   CREATININE 0.9 04/09/2013 0804   CREATININE 1.08 04/02/2013 1445   CREATININE 1.01 02/08/2013 0951   CALCIUM 9.6 04/09/2013 0804   CALCIUM 10.2 04/02/2013 1445   PROT 7.0 04/09/2013 0804   PROT 7.9 04/02/2013 1445   ALBUMIN 3.2* 04/09/2013 0804   ALBUMIN 3.8 04/02/2013 1445   AST 20 04/09/2013 0804   AST 24 04/02/2013 1445   ALT 14  04/09/2013 0804   ALT 12 04/02/2013 1445   ALKPHOS 54 04/09/2013 0804   ALKPHOS 57 04/02/2013 1445   BILITOT 0.93 04/09/2013 0804   BILITOT 0.5 04/02/2013 1445   GFRNONAA 59* 04/02/2013 1445   GFRAA 69* 04/02/2013 1445    No results found for this basename: SPEP, UPEP,  kappa and lambda light chains    Lab Results  Component Value Date   WBC 7.5 04/09/2013   NEUTROABS 5.1 04/09/2013   HGB 13.6 04/09/2013   HCT 41.0 04/09/2013   MCV 97.8 04/09/2013   PLT 175 04/09/2013      Chemistry      Component Value Date/Time   NA 136 04/09/2013 0804   NA 136 04/02/2013 1445   K 4.1 04/09/2013 0804   K 4.9 04/02/2013 1445   CL 98 04/02/2013 1445   CO2 26 04/09/2013 0804   CO2 30 04/02/2013 1445   BUN 13.9 04/09/2013 0804  BUN 17 04/02/2013 1445   CREATININE 0.9 04/09/2013 0804   CREATININE 1.08 04/02/2013 1445   CREATININE 1.01 02/08/2013 0951      Component Value Date/Time   CALCIUM 9.6 04/09/2013 0804   CALCIUM 10.2 04/02/2013 1445   ALKPHOS 54 04/09/2013 0804   ALKPHOS 57 04/02/2013 1445   AST 20 04/09/2013 0804   AST 24 04/02/2013 1445   ALT 14 04/09/2013 0804   ALT 12 04/02/2013 1445   BILITOT 0.93 04/09/2013 0804   BILITOT 0.5 04/02/2013 1445     ASSESSMENT & PLAN:  #1 squamous cell carcinoma of the vallecula The patient is experiencing grade 2-3 toxicity from Erbitux. I'm going to hold his chemotherapy this week and provide supportive care. The patient will continue radiation therapy. #2 significant skin toxicity from Erbitux I will start with clindamycin topical gel as well as Benadryl. If it does not improve, I will start him on minocycline next week. #3 nausea & vomiting I encourage increase oral fluid intake. I have prescribed him with multiple different anti-emetics to control his nausea and vomiting. #4 mucositis This is due to combination side effects from chemotherapy and radiation therapy. As mentioned above I'm to hold his treatment and continue supportive care #5  weight loss He will continue followup with the nutritionist. All questions were answered. The patient knows to call the clinic with any problems, questions or concerns. No barriers to learning was detected. T2 profound toxicity, he would continue to be seeing him on a weekly basis.    Marvella Jenning, MD 04/16/2013 9:51 AM

## 2013-04-16 NOTE — Telephone Encounter (Signed)
gv and printed appt sched and avs for pt for NOV and DEC °

## 2013-04-16 NOTE — Progress Notes (Signed)
Weekly Management Note Current Dose:20 Gy  Projected Dose:70 Gy   Narrative:  The patient presents for routine under treatment assessment.  CBCT/MVCT images/Port film x-rays were reviewed.  The chart was checked. Met with Dr. Bertis Ruddy. Chemo held. No appetite and nauseated with food. Using tube feeds. Meeting with dietician tomorrow. Has pain meds and stool softener but not using much due to constipation. Swallowing sips of apple juice.   Physical Findings:  Mucocitis in posterior oropharynx. Rash on face.   Vitals:  Filed Vitals:   04/16/13 0954  Temp: 99 F (37.2 C)   Weight:  Wt Readings from Last 3 Encounters:  04/16/13 141 lb 3.2 oz (64.048 kg)  04/16/13 141 lb (63.957 kg)  04/15/13 138 lb (62.596 kg)   Lab Results  Component Value Date   WBC 7.5 04/09/2013   HGB 13.6 04/09/2013   HCT 41.0 04/09/2013   MCV 97.8 04/09/2013   PLT 175 04/09/2013   Lab Results  Component Value Date   CREATININE 0.9 04/09/2013   BUN 13.9 04/09/2013   NA 136 04/09/2013   K 4.1 04/09/2013   CL 98 04/02/2013   CO2 26 04/09/2013     Impression:  The patient is tolerating radiation.  Plan:  Continue treatment as planned. Pain medicine prn. Use stool softener. Continue to sip and swallow apple juice. Doing exercises from speech pathology.

## 2013-04-17 ENCOUNTER — Ambulatory Visit
Admission: RE | Admit: 2013-04-17 | Discharge: 2013-04-17 | Disposition: A | Discharge status: Home / Self Care | Payer: Medicare Other | Source: Ambulatory Visit | Attending: Radiation Oncology | Admitting: Radiation Oncology

## 2013-04-17 ENCOUNTER — Ambulatory Visit: Payer: Medicare Other | Admitting: Nutrition

## 2013-04-17 ENCOUNTER — Ambulatory Visit: Payer: Self-pay

## 2013-04-17 DIAGNOSIS — C100 Malignant neoplasm of vallecula: Secondary | ICD-10-CM

## 2013-04-17 MED ORDER — OSMOLITE 1.2 CAL PO LIQD: ORAL | Status: AC

## 2013-04-17 NOTE — Progress Notes (Signed)
Patient and wife present to nutrition followup.  Patient's weight decreased to 141.2 pounds on November 18 from 145.13 pounds November 4.  Patient reports taste alterations and sore throat.  He experienced nausea and vomiting after eating bacon and eggs on Sunday.  He no longer tolerates meals and snacks but enjoys apple juice.  He is tolerating one can of Osmolite 1.2 with 60 mL free water before and after each bolus feeding 3 times a day to providing approximately 855 calories, and 40 g of protein.  Estimated nutrition needs: 1850-2050 calories, 85-100 g protein, 2 L fluid.  Nutrition diagnosis: Food and nutrition related knowledge deficit and inadequate oral intake continues.  Intervention: Patient will increase Osmolite 1.2 to 1-1/2 cans 4 times a day with 60 mL free water before and after each bolus feeding.  He will also consume an additional 240 mL free water daily, either by mouth or via tube.  He will continue to drink apple juice and eat foods as tolerated.  Patient and wife both educated on bolus tube feeding advancement.  Patient and wife able to teach back bolus feeding information.  New tube feeding orders were written and advanced homecare notified.  Patient was provided with a fact with written information on new bolus tube feeding regimen.  Monitoring, evaluation, goals: Patient will tolerate bolus tube feedings and oral intake to provide greater than 90% of minimum calorie, and protein needs to minimize further weight loss.  Cans Osmolite 1.2 will provide 1710 calories, 79 g protein, 1890 mL free water  Next visit: Wednesday, November 26, during chemotherapy.

## 2013-04-18 ENCOUNTER — Ambulatory Visit
Admission: RE | Admit: 2013-04-18 | Discharge: 2013-04-18 | Disposition: A | Discharge status: Home / Self Care | Payer: Medicare Other | Source: Ambulatory Visit | Attending: Radiation Oncology | Admitting: Radiation Oncology

## 2013-04-18 ENCOUNTER — Ambulatory Visit (INDEPENDENT_AMBULATORY_CARE_PROVIDER_SITE_OTHER): Payer: Medicare Other | Admitting: Family Medicine

## 2013-04-18 VITALS — BP 100/60 | HR 70 | Temp 100.2°F | Resp 18 | Ht 69.0 in | Wt 141.0 lb

## 2013-04-18 DIAGNOSIS — F329 Major depressive disorder, single episode, unspecified: Secondary | ICD-10-CM

## 2013-04-18 DIAGNOSIS — C760 Malignant neoplasm of head, face and neck: Secondary | ICD-10-CM

## 2013-04-18 DIAGNOSIS — F32A Depression, unspecified: Secondary | ICD-10-CM

## 2013-04-18 DIAGNOSIS — C4442 Squamous cell carcinoma of skin of scalp and neck: Secondary | ICD-10-CM

## 2013-04-18 MED ORDER — PAROXETINE HCL 10 MG PO TABS: 10.0000 mg | ORAL_TABLET | Freq: Every day | ORAL | Status: AC

## 2013-04-18 NOTE — Patient Instructions (Signed)
Return in 2 weeks for followup visit. Please call to find out when I will be in the office.

## 2013-04-18 NOTE — Progress Notes (Signed)
Subjective: We diagnosed this patient with a squamous cell cancer in his neck. He had a knot in his neck which was biopsied. Nasopharyngoscopy showed a lesion in his throat. He has been started on chemotherapy and radiation. The chemotherapy is been very hard on him, causing a diffuse dermatitis. He has gotten depressed, and life is not worth living this way for him. He wished to discuss physician assistant suicides, and he sent me a long letter regarding this which was confidential. I had him come in here today to have a long talk.  Objective: Not examined  Assessment: Depression Squamous cell carcinoma of neck  Plan: Long at school discussion. I gave him a letter I had confidentially written to him, as well as a discussion handout of this which goes through the ethical/medical/social/spiritual issues.  He was begun on Paxil 10 mg daily. He is to take it every other day for the first couple of doses, then daily. I forgot to tell him when to come back, but will contact him on that.

## 2013-04-18 NOTE — Progress Notes (Signed)
Met with patient and his wife during scheduled appt with Dr. Gorsuch to provide support and care continuity.  Continuing to navigate as L2 (treatments established) patient.  Rick Diehl, RN, BSN, CHPN Head & Neck Oncology Navigator [phone]  

## 2013-04-19 ENCOUNTER — Telehealth: Payer: Self-pay | Admitting: *Deleted

## 2013-04-19 ENCOUNTER — Encounter: Payer: Self-pay | Admitting: *Deleted

## 2013-04-19 ENCOUNTER — Ambulatory Visit
Admission: RE | Admit: 2013-04-19 | Discharge: 2013-04-19 | Disposition: A | Discharge status: Home / Self Care | Payer: Medicare Other | Source: Ambulatory Visit | Attending: Radiation Oncology | Admitting: Radiation Oncology

## 2013-04-19 ENCOUNTER — Ambulatory Visit: Payer: Medicare Other | Admitting: Nutrition

## 2013-04-19 NOTE — Telephone Encounter (Signed)
S/W pt's wife to f/u on phone call she made to [facility] about an hour ago reporting n/v.  Wife states pt vomited this morning after XRT tx and had vomited up his tube feeding and Zofran pill.  Wife states pt is able to sip on water now, but has not taken the zofran again yet.  Instructed her to have pt use phenergan suppository for his nausea,  Wait one hour and then take the Zofran.  Continue the zofran every 8 hrs as needed for any nausea.  Push fluids and call back if unable to keep fluids down in spite of taking anti emetics as directed.  She verbalized understanding.

## 2013-04-19 NOTE — Progress Notes (Signed)
Patient's wife called me on the telephone.  Patient had nausea and vomiting after radiation therapy today.  Patient vomited his nausea medication.  Wife concerned regarding tube feedings.  I educated patient's wife to provide patient with 4 ounces water via tube.  She will wait 15-20 minutes and if patient does not vomit, she will give nausea medication.  If patient does not vomit, she will give 4 ounces of formula every 60 minutes as tolerated along with 4 ounces of water.  Patient's wife was educated to contact physician if vomiting does not resolve and patient cannot keep fluids/medications down.  She verbalizes understanding.

## 2013-04-21 ENCOUNTER — Ambulatory Visit: Payer: Medicare Other

## 2013-04-22 ENCOUNTER — Ambulatory Visit
Admission: RE | Admit: 2013-04-22 | Discharge: 2013-04-22 | Disposition: A | Discharge status: Home / Self Care | Payer: Medicare Other | Source: Ambulatory Visit | Attending: Radiation Oncology | Admitting: Radiation Oncology

## 2013-04-22 NOTE — Progress Notes (Signed)
Met with patient and his wife in waiting area where wife provided me copies of HCPOA and Living Will which I later delivered to RadOnc clerical for scanning.  Wife asked me to explain MOST form in context of Living Will.  Continuing to navigate as L2 (treatments established) patient.  Young Berry, RN, BSN, [facility] Head & Neck Oncology Navigator [phone]

## 2013-04-23 ENCOUNTER — Ambulatory Visit
Admission: RE | Admit: 2013-04-23 | Discharge: 2013-04-23 | Disposition: A | Discharge status: Home / Self Care | Payer: Medicare Other | Source: Ambulatory Visit | Attending: Radiation Oncology | Admitting: Radiation Oncology

## 2013-04-23 ENCOUNTER — Encounter: Payer: Self-pay | Admitting: Hematology and Oncology

## 2013-04-23 ENCOUNTER — Other Ambulatory Visit (HOSPITAL_BASED_OUTPATIENT_CLINIC_OR_DEPARTMENT_OTHER): Payer: Medicare Other | Admitting: Lab

## 2013-04-23 ENCOUNTER — Telehealth: Payer: Self-pay | Admitting: Hematology and Oncology

## 2013-04-23 ENCOUNTER — Ambulatory Visit (HOSPITAL_BASED_OUTPATIENT_CLINIC_OR_DEPARTMENT_OTHER): Payer: Medicare Other | Admitting: Hematology and Oncology

## 2013-04-23 ENCOUNTER — Telehealth: Payer: Self-pay | Admitting: *Deleted

## 2013-04-23 VITALS — BP 137/68 | HR 89 | Temp 99.8°F | Wt 139.9 lb

## 2013-04-23 VITALS — BP 127/67 | HR 96 | Temp 99.1°F | Resp 17 | Ht 69.0 in | Wt 139.7 lb

## 2013-04-23 DIAGNOSIS — K1230 Oral mucositis (ulcerative), unspecified: Secondary | ICD-10-CM

## 2013-04-23 DIAGNOSIS — R21 Rash and other nonspecific skin eruption: Secondary | ICD-10-CM

## 2013-04-23 DIAGNOSIS — K5900 Constipation, unspecified: Secondary | ICD-10-CM

## 2013-04-23 DIAGNOSIS — R634 Abnormal weight loss: Secondary | ICD-10-CM

## 2013-04-23 DIAGNOSIS — C100 Malignant neoplasm of vallecula: Secondary | ICD-10-CM

## 2013-04-23 DIAGNOSIS — K121 Other forms of stomatitis: Secondary | ICD-10-CM

## 2013-04-23 DIAGNOSIS — R112 Nausea with vomiting, unspecified: Secondary | ICD-10-CM

## 2013-04-23 HISTORY — DX: Oral mucositis (ulcerative), unspecified: K12.30

## 2013-04-23 LAB — CBC WITH DIFFERENTIAL/PLATELET
BASO%: 0.7 % (ref 0.0–2.0)
Basophils Absolute: 0.1 10*3/uL (ref 0.0–0.1)
Eosinophils Absolute: 0.1 10*3/uL (ref 0.0–0.5)
HGB: 13 g/dL (ref 13.0–17.1)
LYMPH%: 8 % — ABNORMAL LOW (ref 14.0–49.0)
MONO#: 1.6 10*3/uL — ABNORMAL HIGH (ref 0.1–0.9)
NEUT#: 5.6 10*3/uL (ref 1.5–6.5)
RBC: 4.01 10*6/uL — ABNORMAL LOW (ref 4.20–5.82)
RDW: 13 % (ref 11.0–14.6)
WBC: 8 10*3/uL (ref 4.0–10.3)
lymph#: 0.6 10*3/uL — ABNORMAL LOW (ref 0.9–3.3)

## 2013-04-23 LAB — COMPREHENSIVE METABOLIC PANEL (CC13)
ALT: 32 U/L (ref 0–55)
AST: 28 U/L (ref 5–34)
Albumin: 3.1 g/dL — ABNORMAL LOW (ref 3.5–5.0)
Alkaline Phosphatase: 70 U/L (ref 40–150)
BUN: 13.7 mg/dL (ref 7.0–26.0)
Calcium: 9.5 mg/dL (ref 8.4–10.4)
Chloride: 92 mEq/L — ABNORMAL LOW (ref 98–109)
Creatinine: 0.8 mg/dL (ref 0.7–1.3)
Glucose: 125 mg/dl (ref 70–140)
Potassium: 5.1 mEq/L (ref 3.5–5.1)
Sodium: 129 mEq/L — ABNORMAL LOW (ref 136–145)
Total Protein: 7.4 g/dL (ref 6.4–8.3)

## 2013-04-23 LAB — MAGNESIUM (CC13): Magnesium: 2.1 mg/dl (ref 1.5–2.5)

## 2013-04-23 MED ORDER — FENTANYL 12 MCG/HR TD PT72: 12.0000 ug | MEDICATED_PATCH | TRANSDERMAL | Status: AC

## 2013-04-23 MED ORDER — POLYETHYLENE GLYCOL 3350 17 G PO PACK: 17.0000 g | PACK | Freq: Every day | ORAL | Status: AC

## 2013-04-23 MED ORDER — FLUCONAZOLE 40 MG/ML PO SUSR: ORAL | Status: DC

## 2013-04-23 NOTE — Progress Notes (Signed)
Weekly Management Note Current Dose:30 Gy  Projected Dose:70 Gy   Narrative:  The patient presents for routine under treatment assessment.  CBCT/MVCT images/Port film x-rays were reviewed.  The chart was checked. Fairly miserable with treatment. Doing tube feeds now with antiemetic prior to feeding. Throat pain well controlled "as long as I don't swallow". Would like to discuss de-intenseifying treatment. Questions from family members re:HPV per navigator and SW.  Not using pain medications.   Physical Findings:  Mucocitis in posterior oropharynx along with thrush.   Vitals:  Filed Vitals:   04/23/13 0911  BP: 137/68  Pulse: 89  Temp: 99.8 F (37.7 C)   Weight:  Wt Readings from Last 3 Encounters:  04/23/13 139 lb 11.2 oz (63.368 kg)  04/23/13 139 lb 14.4 oz (63.458 kg)  04/18/13 141 lb (63.957 kg)   Lab Results  Component Value Date   WBC 8.0 04/23/2013   HGB 13.0 04/23/2013   HCT 38.6 04/23/2013   MCV 96.2 04/23/2013   PLT 297 04/23/2013   Lab Results  Component Value Date   CREATININE 0.9 04/09/2013   BUN 13.9 04/09/2013   NA 136 04/09/2013   K 4.1 04/09/2013   CL 98 04/02/2013   CO2 26 04/09/2013     Impression:  The patient is tolerating radiation.  Plan:  Continue treatment as planned. Treat thrush with nystatin. Will discuss holding erbitux with Dr. Bertis Ruddy. Encouraged him to think about pain medications. Edcuated and provided handout on HPV

## 2013-04-23 NOTE — Telephone Encounter (Signed)
Patient's wife called expressing concern that he had an episode of emesis that she described as "pink tinged, like pink grapefruit".  She denied that it was bright red; could not attribute it to anything he took by mouth or by PEG.  She noted that it occurred shortly after tube feeding.  I suggested that the tinge may be a result of ruptured capillaries in his throat due to irritation resulting from this and recent episodes of emesis.  I asked her to maintain a log of this and future episodes.  I directed her to call the after-hour MD on-call if he has another episode, particularly if it is brighter red in color.  She indicated understanding.  Young Berry, RN, BSN, [facility] Head & Neck Oncology Navigator [phone]

## 2013-04-23 NOTE — Progress Notes (Signed)
[facility] OFFICE PROGRESS NOTE  Patient Care Team: Derrick Najjar, MD as PCP - General (Family Medicine) Derrick Bast, RN as Registered Nurse (Oncology) Derrick Delay, MD as Consulting Physician (Hematology and Oncology)  DIAGNOSIS: Squamous cell carcinoma of the vallecula, ongoing chemotherapy and radiation therapy  SUMMARY OF ONCOLOGIC HISTORY: Oncology History   Cancer of vallecula   Primary site: Pharynx - Oropharynx   Staging method: AJCC 7th Edition   Clinical: Stage IVA (T2, N2c, M0) signed by Lonie Peak, MD on 03/15/2013  5:35 PM   Summary: Stage IVA (T2, N2c, M0)      Cancer of vallecula   02/22/2013 Imaging Ct scan of neck showed abnormal lymphadenopathy   03/08/2013 Surgery Laryngoscopy and biopsy confirmed SCC of vallecula   03/20/2013 Imaging PET/CT scan confirmed cancer at the base of tongue and regional lympadenopathy   04/03/2013 -  Radiation Therapy Start of XRT   04/03/2013 -  Chemotherapy cycle 1 of weekly Erbitux.    04/05/2013 Procedure Placement of infusaport & feeding tube   04/17/2013 Adverse Reaction Chemo was on hold due to grade 2-3 skin toxicity and mucositis    INTERVAL HISTORY: Derrick [name] 79 y.o. [name] returns for further followup. He is not doing well. He has significant nausea and vomiting last week, resolved with anti-emetics. He continued to lose weight. He is developing further worsening mucositis but refuses to take pain medicine. The skin rash in his face and chest is resolving with topical creams that I prescribed. He is also constipated for 2 days.  I have reviewed the past medical history, past surgical history, social history and family history with the patient and they are unchanged from previous note.  ALLERGIES:  is allergic to aspirin.  MEDICATIONS:  Current Outpatient Prescriptions  Medication Sig Dispense Refill  . clindamycin (CLINDAGEL) 1 % gel Apply topically 2 (two) times daily.  60 g  0  .  clopidogrel (PLAVIX) 75 MG tablet Take 75 mg by mouth daily after breakfast.      . lidocaine-prilocaine (EMLA) cream Apply 1 application topically as needed (For port-a-cath. one hour prior to needle stick).       . Nutritional Supplements (FEEDING SUPPLEMENT, OSMOLITE 1.2 CAL,) LIQD Change Osmolite 1.2 to 1.5 cans QID with 60 ml free water before and after each bolus feeding.  In addition, drink or flush feeding tube with an additional 240 ml free water.  1422 mL  0  . ondansetron (ZOFRAN) 8 MG tablet Take 1 tablet (8 mg total) by mouth every 8 (eight) hours as needed for nausea.  30 tablet  3  . oxyCODONE-acetaminophen (PERCOCET/ROXICET) 5-325 MG per tablet Take 1-2 tablets by mouth every 4 (four) hours as needed for moderate pain.  30 tablet  0  . PARoxetine (PAXIL) 10 MG tablet Take 1 tablet (10 mg total) by mouth daily.  30 tablet  1  . PRESCRIPTION MEDICATION Place 1 application into both eyes at bedtime. Sochlor (Sodium Chloride) 5% Ointment      . promethazine (PHENERGAN) 25 MG suppository Place 1 suppository (25 mg total) rectally 2 (two) times daily as needed for nausea (if unable to swallow oral anti-emetics).  10 suppository  1  . promethazine (PHENERGAN) 25 MG tablet Take 1 tablet (25 mg total) by mouth every 6 (six) hours as needed for nausea.  30 tablet  3  . propranolol (INDERAL) 20 MG tablet Take 20 mg by mouth daily after breakfast.      .  simvastatin (ZOCOR) 20 MG tablet Take 20 mg by mouth daily after supper.      . sodium chloride (MURO 128) 5 % ophthalmic solution Place 1 drop into both eyes every morning.      . sodium fluoride (PREVIDENT 5000 PLUS) 1.1 % CREA dental cream Place 1 application onto teeth at bedtime. Apply fluoride to toothbrush. Brush for 2 minutes. Spit out excess. Do not rinse. Repeat nightly.      . fentaNYL (DURAGESIC - DOSED MCG/HR) 12 MCG/HR Place 1 patch (12.5 mcg total) onto the skin every 3 (three) days.  5 patch  0  . fluconazole (DIFLUCAN) 40 MG/ML  suspension Take 5 ml per tube day one then 2.5 ml per tube days two through seven.  35 mL  0  . polyethylene glycol (MIRALAX / GLYCOLAX) packet Take 17 g by mouth daily.  14 each  1   No current facility-administered medications for this visit.    REVIEW OF SYSTEMS:   Constitutional: Denies fevers, chills or abnormal weight loss Eyes: Denies blurriness of vision Respiratory: Denies cough, dyspnea or wheezes Cardiovascular: Denies palpitation, chest discomfort or lower extremity swelling Lymphatics: Denies new lymphadenopathy or easy bruising Neurological:Denies numbness, tingling or new weaknesses Behavioral/Psych: Mood is stable, no new changes  All other systems were reviewed with the patient and are negative.  PHYSICAL EXAMINATION: ECOG PERFORMANCE STATUS: 1 - Symptomatic but completely ambulatory  Filed Vitals:   04/23/13 1006  BP: 127/67  Pulse: 96  Temp: 99.1 F (37.3 C)  Resp: 17   Filed Weights   04/23/13 1006  Weight: 139 lb 11.2 oz (63.368 kg)    GENERAL:alert, no distress and comfortable SKIN: Previously noted skin rashes healing EYES: normal, Conjunctiva are pink and non-injected, sclera clear OROPHARYNX:significant mucositis with mild ulceration grade 2 NECK: supple, thyroid normal size, non-tender, without nodularity ABDOMEN:abdomen soft, non-tender and normal bowel sounds Musculoskeletal:no cyanosis of digits and no clubbing  NEURO: alert & oriented x 3 with fluent speech, no focal motor/sensory deficits  LABORATORY DATA:  I have reviewed the data as listed    Component Value Date/Time   NA 129* 04/23/2013 0943   NA 136 04/02/2013 1445   K 5.1 04/23/2013 0943   K 4.9 04/02/2013 1445   CL 98 04/02/2013 1445   CO2 28 04/23/2013 0943   CO2 30 04/02/2013 1445   GLUCOSE 125 04/23/2013 0943   GLUCOSE 91 04/02/2013 1445   BUN 13.7 04/23/2013 0943   BUN 17 04/02/2013 1445   CREATININE 0.8 04/23/2013 0943   CREATININE 1.08 04/02/2013 1445   CREATININE 1.01  02/08/2013 0951   CALCIUM 9.5 04/23/2013 0943   CALCIUM 10.2 04/02/2013 1445   PROT 7.4 04/23/2013 0943   PROT 7.9 04/02/2013 1445   ALBUMIN 3.1* 04/23/2013 0943   ALBUMIN 3.8 04/02/2013 1445   AST 28 04/23/2013 0943   AST 24 04/02/2013 1445   ALT 32 04/23/2013 0943   ALT 12 04/02/2013 1445   ALKPHOS 70 04/23/2013 0943   ALKPHOS 57 04/02/2013 1445   BILITOT 0.82 04/23/2013 0943   BILITOT 0.5 04/02/2013 1445   GFRNONAA 59* 04/02/2013 1445   GFRAA 69* 04/02/2013 1445    No results found for this basename: SPEP, UPEP,  kappa and lambda light chains    Lab Results  Component Value Date   WBC 8.0 04/23/2013   NEUTROABS 5.6 04/23/2013   HGB 13.0 04/23/2013   HCT 38.6 04/23/2013   MCV 96.2 04/23/2013   PLT 297 04/23/2013  Chemistry      Component Value Date/Time   NA 129* 04/23/2013 0943   NA 136 04/02/2013 1445   K 5.1 04/23/2013 0943   K 4.9 04/02/2013 1445   CL 98 04/02/2013 1445   CO2 28 04/23/2013 0943   CO2 30 04/02/2013 1445   BUN 13.7 04/23/2013 0943   BUN 17 04/02/2013 1445   CREATININE 0.8 04/23/2013 0943   CREATININE 1.08 04/02/2013 1445   CREATININE 1.01 02/08/2013 0951      Component Value Date/Time   CALCIUM 9.5 04/23/2013 0943   CALCIUM 10.2 04/02/2013 1445   ALKPHOS 70 04/23/2013 0943   ALKPHOS 57 04/02/2013 1445   AST 28 04/23/2013 0943   AST 24 04/02/2013 1445   ALT 32 04/23/2013 0943   ALT 12 04/02/2013 1445   BILITOT 0.82 04/23/2013 0943   BILITOT 0.5 04/02/2013 1445     ASSESSMENT & PLAN:  #1 squamous cell carcinoma of the vallecula We will continue to hold chemotherapy. The patient is not tolerating treatment well. We will continue aggressive supportive care. #2 skin rash from Erbitux He will continue clindamycin gel and hydrocortisone cream. It is improving. We will continue to hold chemotherapy #3 nausea and vomiting I continue to encourage him to take his anti-emetics and increase oral fluid intake take if possible #4 mucositis This is due to side  effects of treatment. I will continue to hold chemotherapy. For his mouth pain, I recommend starting him on fentanyl patch for pain control. He agrees. Side effects to be expected from pain medicine was discussed with the patient including risk of nausea, constipation and sedation #5 weight loss He will continue followup with nutritionist and to use his feeding tube as tolerated #6 constipation I given a prescription of MiraLax to use every day. All questions were answered. The patient knows to call the clinic with any problems, questions or concerns. No barriers to learning was detected.    Shaniqua Guillot, MD 04/23/2013 10:44 AM

## 2013-04-23 NOTE — Progress Notes (Signed)
Weekly assessment of radiation to bilateral neck.Skin mild discoloration and rash improved.Sore throat.White patches in mouth.Started on paxil in last week.Weight down 1 lb in last week.

## 2013-04-23 NOTE — Telephone Encounter (Signed)
gv and printed appt sched and avs for pt for NOV and DEC °

## 2013-04-24 ENCOUNTER — Ambulatory Visit: Payer: Self-pay

## 2013-04-24 ENCOUNTER — Ambulatory Visit: Payer: Medicare Other | Admitting: Nutrition

## 2013-04-24 ENCOUNTER — Encounter: Payer: Self-pay | Admitting: *Deleted

## 2013-04-24 ENCOUNTER — Encounter: Payer: Self-pay | Admitting: Nutrition

## 2013-04-24 ENCOUNTER — Ambulatory Visit
Admission: RE | Admit: 2013-04-24 | Discharge: 2013-04-24 | Disposition: A | Discharge status: Home / Self Care | Payer: Medicare Other | Source: Ambulatory Visit | Attending: Radiation Oncology | Admitting: Radiation Oncology

## 2013-04-24 NOTE — Progress Notes (Signed)
Met with patient and his wife after visit with Vernell Leep s/p call from [place] indicating wife wanted some guidance on med administration.  She expressed concern about the sequencing of diflucan, Miralax with nutritional feedings b/c she did not want to cause N&V.  I explained that the diflucan could be mixed in water as part of pre-feeding flush or mixed with well-dissolved Miralax.  Because of the importance of daily administration of these meds and her concern about emesis, I suggested reducing the volume of nutritional supplement when giving meds so as not to cause fullness that might lead to N&V.  She indicated she would try these suggestions.  Young Berry, RN, BSN, [facility] Head & Neck Oncology Navigator [phone]

## 2013-04-24 NOTE — Progress Notes (Signed)
Patient and wife present to nutrition followup.  Weight has decreased to 139.9 pounds from 141.2 pounds November 18.  Patient has had issues with pain.  However, he is now on a pain patch.  He is taking MiraLax for constipation.  He is no longer eating or drinking by mouth except for a small amount of water.  He is taking his nausea medications as prescribed.  His wife continues to assist him with getting Osmolite 1.2 via feeding tube.  Patient has not achieved goal rate of 6 cans.  He had an episode of vomiting several days ago.  Patient's wife has questions regarding medication schedule.  Nutrition diagnosis: Food and nutrition related knowledge deficit and inadequate oral intake both continue.  Intervention: Patient will work to increase Osmolite 1.2 to 1-1/2 cans 4 times a day with 60 mL free water before and after each bolus feeding.  He will continue to drink an additional 240 mL free water daily.  Patient referred to head and neck navigator for clarification on medication administration.  Patient and wife encouraged to consider continuous tube feedings if patient unable to tolerate bolus feedings at goal rate.  Monitoring, evaluation, goals: Patient will tolerate bolus tube feedings at goal rate to meet 90% of minimum caloric and protein needs to minimize further weight loss. Patient to consider continuous feedings if unable to achieve goal rate with bolus feedings.  Next visit: Wednesday, December 3, during chemotherapy.

## 2013-04-29 ENCOUNTER — Ambulatory Visit
Admission: RE | Admit: 2013-04-29 | Discharge: 2013-04-29 | Disposition: A | Discharge status: Home / Self Care | Payer: Medicare Other | Source: Ambulatory Visit | Attending: Radiation Oncology | Admitting: Radiation Oncology

## 2013-04-30 ENCOUNTER — Ambulatory Visit: Payer: Medicare Other | Admitting: Nutrition

## 2013-04-30 ENCOUNTER — Telehealth: Payer: Self-pay | Admitting: *Deleted

## 2013-04-30 ENCOUNTER — Encounter: Payer: Self-pay | Admitting: *Deleted

## 2013-04-30 ENCOUNTER — Telehealth: Payer: Self-pay | Admitting: Hematology and Oncology

## 2013-04-30 ENCOUNTER — Other Ambulatory Visit (HOSPITAL_BASED_OUTPATIENT_CLINIC_OR_DEPARTMENT_OTHER): Payer: Medicare Other | Admitting: Lab

## 2013-04-30 ENCOUNTER — Ambulatory Visit
Admission: RE | Admit: 2013-04-30 | Discharge: 2013-04-30 | Disposition: A | Discharge status: Home / Self Care | Payer: Medicare Other | Source: Ambulatory Visit | Attending: Radiation Oncology | Admitting: Radiation Oncology

## 2013-04-30 ENCOUNTER — Ambulatory Visit (HOSPITAL_BASED_OUTPATIENT_CLINIC_OR_DEPARTMENT_OTHER): Payer: Medicare Other | Admitting: Hematology and Oncology

## 2013-04-30 VITALS — BP 95/65 | HR 105 | Temp 98.5°F | Ht 69.0 in | Wt 139.4 lb

## 2013-04-30 VITALS — BP 128/71 | HR 96 | Temp 99.6°F | Resp 20 | Ht 69.0 in | Wt 138.6 lb

## 2013-04-30 DIAGNOSIS — R112 Nausea with vomiting, unspecified: Secondary | ICD-10-CM

## 2013-04-30 DIAGNOSIS — E860 Dehydration: Secondary | ICD-10-CM

## 2013-04-30 DIAGNOSIS — C100 Malignant neoplasm of vallecula: Secondary | ICD-10-CM

## 2013-04-30 DIAGNOSIS — F3289 Other specified depressive episodes: Secondary | ICD-10-CM

## 2013-04-30 DIAGNOSIS — F329 Major depressive disorder, single episode, unspecified: Secondary | ICD-10-CM

## 2013-04-30 DIAGNOSIS — I10 Essential (primary) hypertension: Secondary | ICD-10-CM

## 2013-04-30 DIAGNOSIS — E871 Hypo-osmolality and hyponatremia: Secondary | ICD-10-CM

## 2013-04-30 DIAGNOSIS — R42 Dizziness and giddiness: Secondary | ICD-10-CM

## 2013-04-30 LAB — CBC WITH DIFFERENTIAL/PLATELET
Basophils Absolute: 0 10*3/uL (ref 0.0–0.1)
Eosinophils Absolute: 0 10*3/uL (ref 0.0–0.5)
HCT: 37.4 % — ABNORMAL LOW (ref 38.4–49.9)
HGB: 12.7 g/dL — ABNORMAL LOW (ref 13.0–17.1)
LYMPH%: 9.3 % — ABNORMAL LOW (ref 14.0–49.0)
MCH: 33 pg (ref 27.2–33.4)
MONO#: 1.4 10*3/uL — ABNORMAL HIGH (ref 0.1–0.9)
NEUT#: 4.2 10*3/uL (ref 1.5–6.5)
Platelets: 302 10*3/uL (ref 140–400)
RDW: 13.1 % (ref 11.0–14.6)
lymph#: 0.6 10*3/uL — ABNORMAL LOW (ref 0.9–3.3)

## 2013-04-30 LAB — COMPREHENSIVE METABOLIC PANEL (CC13)
ALT: 145 U/L — ABNORMAL HIGH (ref 0–55)
Albumin: 2.7 g/dL — ABNORMAL LOW (ref 3.5–5.0)
Alkaline Phosphatase: 85 U/L (ref 40–150)
BUN: 15.9 mg/dL (ref 7.0–26.0)
Calcium: 9.4 mg/dL (ref 8.4–10.4)
Chloride: 92 mEq/L — ABNORMAL LOW (ref 98–109)
Creatinine: 0.9 mg/dL (ref 0.7–1.3)
Glucose: 132 mg/dl (ref 70–140)
Potassium: 4.8 mEq/L (ref 3.5–5.1)

## 2013-04-30 LAB — MAGNESIUM (CC13): Magnesium: 2.1 mg/dl (ref 1.5–2.5)

## 2013-04-30 MED ORDER — METOCLOPRAMIDE HCL 5 MG/5ML PO SOLN: 10.0000 mg | Freq: Three times a day (TID) | ORAL | Status: AC

## 2013-04-30 NOTE — Progress Notes (Signed)
Met with patient and wife.  Patient has not consistently tolerated bolus tube feeding at goal rate.  He has occasional vomiting due to thick mucus.  Patient has not been open to switching to continuous tube feedings.  He is having bowel movements.  Patient unable to swallow fluids.  Weight is stable at 139.4 pounds today.  Nutrition Diagnosis:  Food and Nutrition Related Knowledge Deficit and Inadequate Oral Intake both continue.  Intervention:  Patient will continue bolus tube feedings to reach goal of 6 cans of Osmolite 1.2 daily.  He will tolerate 1.5 cans four times daily.  Patient is eligible for continuous pump feeding and I will continue to explore this option with him.  Proper tube feeding methods were enforced.  Monitoring, evaluation, and goals: Patient will tolerate bolus tube feeding to meet greater than 90% of patient's estimated needs to minimize weight loss.  Next visit: Wednesday, December 3.

## 2013-04-30 NOTE — Progress Notes (Signed)
Weekly Management Note Current Dose:36 Gy  Projected Dose:70 Gy   Narrative:  The patient presents for routine under treatment assessment.  CBCT/MVCT images/Port film x-rays were reviewed.  The chart was checked. Nausea and vomitting with associated thick saliva. Sleeps best sitting up. 6 cans on Sunday but vomiting yesterday. Would like to hold off on scopalamine patches and continuous feeds. Would like to hold off on IV fluids today.   Physical Findings:  Rash on face is healing. No oral thrush. Alert and oriented. Appears ill.   Vitals:  Filed Vitals:   04/30/13 0942  BP: 95/65  Pulse: 105  Temp:    Weight:  Wt Readings from Last 3 Encounters:  04/30/13 139 lb 6.4 oz (63.231 kg)  04/23/13 139 lb 11.2 oz (63.368 kg)  04/23/13 139 lb 14.4 oz (63.458 kg)   Lab Results  Component Value Date   WBC 8.0 04/23/2013   HGB 13.0 04/23/2013   HCT 38.6 04/23/2013   MCV 96.2 04/23/2013   PLT 297 04/23/2013   Lab Results  Component Value Date   CREATININE 0.8 04/23/2013   BUN 13.7 04/23/2013   NA 129* 04/23/2013   K 5.1 04/23/2013   CL 98 04/02/2013   CO2 28 04/23/2013     Impression:  The patient is tolerating radiation.  Plan:  Continue treatment as planned. IVF tomorrow. Try reglan and home suction. Try continuous tube feeds. Re-eval on Thursday.

## 2013-04-30 NOTE — Progress Notes (Signed)
[facility] OFFICE PROGRESS NOTE  Patient Care Team: [name], MD as PCP - General (Family Medicine) Dennis Bast, RN as Registered Nurse (Oncology) Artis Delay, MD as Consulting Physician (Hematology and Oncology)  DIAGNOSIS: Squamous cell carcinoma of the vallecula, ongoing treatment  SUMMARY OF ONCOLOGIC HISTORY: Oncology History   Cancer of vallecula   Primary site: Pharynx - Oropharynx   Staging method: AJCC 7th Edition   Clinical: Stage IVA (T2, N2c, M0) signed by Lonie Peak, MD on 03/15/2013  5:35 PM   Summary: Stage IVA (T2, N2c, M0)      Cancer of vallecula   02/22/2013 Imaging Ct scan of neck showed abnormal lymphadenopathy   03/08/2013 Surgery Laryngoscopy and biopsy confirmed SCC of vallecula   03/20/2013 Imaging PET/CT scan confirmed cancer at the base of tongue and regional lympadenopathy   04/03/2013 -  Radiation Therapy Start of XRT   04/03/2013 -  Chemotherapy cycle 1 of weekly Erbitux.    04/05/2013 Procedure Placement of infusaport & feeding tube   04/17/2013 Adverse Reaction Chemo was on hold due to grade 2-3 skin toxicity and mucositis    INTERVAL HISTORY: DAISY MCNEEL 79 y.o. male returns for further followup. He has progressive weight loss. He had regular nausea and vomiting. He felt that the nausea is induced by the production of the thick secretion. He is not consistent with his daily feeding through the feeding tube. He denies any significant pain from mucositis. His constipation is well manage with the addition of MiraLax. His skin rash is improving. He has significant history of dizziness over the weekend and occasional confusion.  I have reviewed the past medical history, past surgical history, social history and family history with the patient and they are unchanged from previous note.  ALLERGIES:  is allergic to aspirin.  MEDICATIONS:  Current Outpatient Prescriptions  Medication Sig Dispense Refill  . clindamycin  (CLINDAGEL) 1 % gel Apply topically 2 (two) times daily.  60 g  0  . fentaNYL (DURAGESIC - DOSED MCG/HR) 12 MCG/HR Place 1 patch (12.5 mcg total) onto the skin every 3 (three) days.  5 patch  0  . fluconazole (DIFLUCAN) 40 MG/ML suspension Take 5 ml per tube day one then 2.5 ml per tube days two through seven.      . lidocaine-prilocaine (EMLA) cream Apply 1 application topically as needed (For port-a-cath. one hour prior to needle stick).       . metoCLOPramide (REGLAN) 5 MG/5ML solution Place 10 mLs (10 mg total) into feeding tube 4 (four) times daily -  before meals and at bedtime.  480 mL  1  . Nutritional Supplements (FEEDING SUPPLEMENT, OSMOLITE 1.2 CAL,) LIQD Change Osmolite 1.2 to 1.5 cans QID with 60 ml free water before and after each bolus feeding.  In addition, drink or flush feeding tube with an additional 240 ml free water.  1422 mL  0  . ondansetron (ZOFRAN) 8 MG tablet Take 1 tablet (8 mg total) by mouth every 8 (eight) hours as needed for nausea.  30 tablet  3  . oxyCODONE-acetaminophen (PERCOCET/ROXICET) 5-325 MG per tablet Take 1-2 tablets by mouth every 4 (four) hours as needed for moderate pain.  30 tablet  0  . PARoxetine (PAXIL) 10 MG tablet Take 1 tablet (10 mg total) by mouth daily.  30 tablet  1  . polyethylene glycol (MIRALAX / GLYCOLAX) packet Take 17 g by mouth daily.  14 each  1  . PRESCRIPTION  MEDICATION Place 1 application into both eyes at bedtime. Sochlor (Sodium Chloride) 5% Ointment      . promethazine (PHENERGAN) 25 MG suppository Place 1 suppository (25 mg total) rectally 2 (two) times daily as needed for nausea (if unable to swallow oral anti-emetics).  10 suppository  1  . promethazine (PHENERGAN) 25 MG tablet Take 1 tablet (25 mg total) by mouth every 6 (six) hours as needed for nausea.  30 tablet  3  . sodium chloride (MURO 128) 5 % ophthalmic solution Place 1 drop into both eyes every morning.      . sodium fluoride (PREVIDENT 5000 PLUS) 1.1 % CREA dental  cream Place 1 application onto teeth at bedtime. Apply fluoride to toothbrush. Brush for 2 minutes. Spit out excess. Do not rinse. Repeat nightly.       No current facility-administered medications for this visit.    REVIEW OF SYSTEMS:   Eyes: Denies blurriness of vision Respiratory: Denies cough, dyspnea or wheezes Cardiovascular: Denies palpitation, chest discomfort or lower extremity swelling Lymphatics: Denies new lymphadenopathy or easy bruising Neurological:Denies numbness, tingling or new weaknesses Behavioral/Psych: Mood is stable, no new changes  All other systems were reviewed with the patient and are negative.  PHYSICAL EXAMINATION: ECOG PERFORMANCE STATUS: 2 - Symptomatic, <50% confined to bed  Filed Vitals:   04/30/13 1042  BP: 128/71  Pulse: 96  Temp: 99.6 F (37.6 C)  Resp: 20   Filed Weights   04/30/13 1042  Weight: 138 lb 9.6 oz (62.869 kg)    GENERAL:alert, in mild distress and comfortable. He looks thin and mildly cachectic SKIN: Previously noted skin rash is improving EYES: normal, Conjunctiva are pink and non-injected, sclera clear OROPHARYNX: Significant grade 1-2 mucositis present, no evidence of thrush NECK: supple, thyroid normal size, non-tender, without nodularity LYMPH: Previously palpable lymph nodes are getting smaller  LUNGS: clear to auscultation and percussion with normal breathing effort HEART: regular rate & rhythm and no murmurs and no lower extremity edema ABDOMEN:abdomen soft, non-tender and normal bowel sounds. Feeding tube site looks okay Musculoskeletal:no cyanosis of digits and no clubbing  NEURO: alert & oriented x 3 with fluent speech, no focal motor/sensory deficits  LABORATORY DATA:  I have reviewed the data as listed    Component Value Date/Time   NA 129* 04/30/2013 1026   NA 136 04/02/2013 1445   K 4.8 04/30/2013 1026   K 4.9 04/02/2013 1445   CL 98 04/02/2013 1445   CO2 26 04/30/2013 1026   CO2 30 04/02/2013 1445   GLUCOSE  132 04/30/2013 1026   GLUCOSE 91 04/02/2013 1445   BUN 15.9 04/30/2013 1026   BUN 17 04/02/2013 1445   CREATININE 0.9 04/30/2013 1026   CREATININE 1.08 04/02/2013 1445   CREATININE 1.01 02/08/2013 0951   CALCIUM 9.4 04/30/2013 1026   CALCIUM 10.2 04/02/2013 1445   PROT 7.2 04/30/2013 1026   PROT 7.9 04/02/2013 1445   ALBUMIN 2.7* 04/30/2013 1026   ALBUMIN 3.8 04/02/2013 1445   AST 96* 04/30/2013 1026   AST 24 04/02/2013 1445   ALT 145* 04/30/2013 1026   ALT 12 04/02/2013 1445   ALKPHOS 85 04/30/2013 1026   ALKPHOS 57 04/02/2013 1445   BILITOT 0.63 04/30/2013 1026   BILITOT 0.5 04/02/2013 1445   GFRNONAA 59* 04/02/2013 1445   GFRAA 69* 04/02/2013 1445    No results found for this basename: SPEP, UPEP,  kappa and lambda light chains    Lab Results  Component Value Date  WBC 6.3 04/30/2013   NEUTROABS 4.2 04/30/2013   HGB 12.7* 04/30/2013   HCT 37.4* 04/30/2013   MCV 97.0 04/30/2013   PLT 302 04/30/2013      Chemistry      Component Value Date/Time   NA 129* 04/30/2013 1026   NA 136 04/02/2013 1445   K 4.8 04/30/2013 1026   K 4.9 04/02/2013 1445   CL 98 04/02/2013 1445   CO2 26 04/30/2013 1026   CO2 30 04/02/2013 1445   BUN 15.9 04/30/2013 1026   BUN 17 04/02/2013 1445   CREATININE 0.9 04/30/2013 1026   CREATININE 1.08 04/02/2013 1445   CREATININE 1.01 02/08/2013 0951      Component Value Date/Time   CALCIUM 9.4 04/30/2013 1026   CALCIUM 10.2 04/02/2013 1445   ALKPHOS 85 04/30/2013 1026   ALKPHOS 57 04/02/2013 1445   AST 96* 04/30/2013 1026   AST 24 04/02/2013 1445   ALT 145* 04/30/2013 1026   ALT 12 04/02/2013 1445   BILITOT 0.63 04/30/2013 1026   BILITOT 0.5 04/02/2013 1445      ASSESSMENT & PLAN:  #1 squamous cell carcinoma of the vallecula Due to significant side effects and intolerance to treatment, we made an informed decision to discontinue chemotherapy. He agreed. We will continue aggressive supportive care. #2 nausea, vomiting and dehydration We will continue anti-emetics. He is  prescribed Reglan in addition to his anti-emetics. I recommend DAILY intravenous fluids and intravenous anti-emetics. He agreed. #3 hypotension, dizziness This is due to hypotension. I recommend we start IV fluids today but the patient declined #4 mild pain This is well controlled with fentanyl patch and liquid oxycodone as needed #5 constipation, resolved He'll continue to use MiraLax as needed #6 hyponatremia This is due to side effects of treatment and dehydration. I recommend IV fluids daily #7 depression Regards social worker involved and possible referral to psychiatrist. All questions were answered. The patient knows to call the clinic with any problems, questions or concerns. No barriers to learning was detected.    Alawna Graybeal, MD 04/30/2013 11:42 AM

## 2013-04-30 NOTE — Progress Notes (Signed)
Met with patient and his wife prior to and during scheduled appointments with Drs. Michell Heinrich and Chatmoss.  In context of 1) hypotensive BP during orthostatic determinations in RadOnc, and 2) wife's report of patient episodes of confusion, I expressed concern that patient is regularly negotiating stairs at home.  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  For his safety, I encouraged him to reduce trips upstairs and to consider sleeping downstairs until his dehydration is corrected.  His wife indicated to patient that she can check for  emails from his editor which would reduce the frequency of this trips upstairs; patient indicated agreement with the idea.  His wife responded that they will consider the temporary sleeping arrangements.  Continuing to navigate as L2 (treatments established) patient.  Young Berry, RN, BSN, [facility] Head & Neck Oncology Navigator [phone]

## 2013-04-30 NOTE — Telephone Encounter (Signed)
appts made per 12/2 POF AVS and CAL printed shh

## 2013-04-30 NOTE — Progress Notes (Signed)
Mr. Has received 18 fractions to his Vallecular and bilateral neck.  Increase in facial rash since having  Erbitux.  Erbitux is on hold presently since he had blisters in his mouth and marked rash.  He is experiencing pain upon swallowing and is even having difficulty swallowing water.  His mouth is moist , but he has thickened saliva which contributes to his nausea/vomiting.  He appears very fatigued and is sitting with his eyes closed.  He has enteral nutrition, but has not had any since 530 pm on yesterday since having emesis.  He has had 4 episodes of N&V since Sunday.  His spouse states he has periors in which he is "talking nonsense and not making any sense."  Gagging with expectoration of thickened saliva presently.  At times his saliva has streaks of blood.

## 2013-04-30 NOTE — Telephone Encounter (Signed)
Per staff message and POF I have scheduled appts.  JMW  

## 2013-05-01 ENCOUNTER — Ambulatory Visit
Admission: RE | Admit: 2013-05-01 | Discharge: 2013-05-01 | Disposition: A | Discharge status: Home / Self Care | Payer: Medicare Other | Source: Ambulatory Visit | Attending: Radiation Oncology | Admitting: Radiation Oncology

## 2013-05-01 ENCOUNTER — Ambulatory Visit (HOSPITAL_BASED_OUTPATIENT_CLINIC_OR_DEPARTMENT_OTHER): Payer: Medicare Other

## 2013-05-01 ENCOUNTER — Encounter: Payer: Self-pay | Admitting: *Deleted

## 2013-05-01 ENCOUNTER — Ambulatory Visit: Payer: Medicare Other | Admitting: Nutrition

## 2013-05-01 DIAGNOSIS — E860 Dehydration: Secondary | ICD-10-CM

## 2013-05-01 DIAGNOSIS — C100 Malignant neoplasm of vallecula: Secondary | ICD-10-CM

## 2013-05-01 DIAGNOSIS — R112 Nausea with vomiting, unspecified: Secondary | ICD-10-CM

## 2013-05-01 MED ORDER — ONDANSETRON 8 MG/NS 50 ML IVPB
INTRAVENOUS | Status: AC
  Filled 2013-05-01: qty 8

## 2013-05-01 MED ORDER — SODIUM CHLORIDE 0.9 % IV SOLN
4.0000 mg | Freq: Every day | INTRAVENOUS | Status: DC | PRN
  Filled 2013-05-01: qty 2

## 2013-05-01 MED ORDER — SODIUM CHLORIDE 0.9 % IV SOLN
Freq: Every day | INTRAVENOUS | Status: DC
  Administered 2013-05-01: 10:00:00 via INTRAVENOUS

## 2013-05-01 MED ORDER — ONDANSETRON 8 MG/50ML IVPB (CHCC)
8.0000 mg | Freq: Once | INTRAVENOUS | Status: AC
  Administered 2013-05-01: 8 mg via INTRAVENOUS

## 2013-05-01 NOTE — Progress Notes (Signed)
Patient is receiving IV fluids.  Patient is improved from yesterday.  He denies any nausea or vomiting.  He tolerated Reglan without difficulty.  He was able to take 1-1/2 cans of Osmolite 1.2 in 2 separate feedings last night.  Nutrition diagnosis: Food and nutrition related knowledge deficit and inadequate oral intake both continue.  Intervention: Educated patient's wife to resume Osmolite 1.2 today using 1-1/2 cans 4 times a day as previously tolerated.  Educated patient's wife to bring Osmolite 1.2 daily for patient will receiving fluids.  This will prevent him from missing a feeding. Teach back method used.  Encouraged compliance.  Monitoring, evaluation, goals: Patient will tolerate bolus tube feeding to meet greater than 90% of patient's estimated needs to minimize weight loss.  Will explore continuous tube feeding if patient unable to tolerate bolus feeds.  Next visit: I will continue to work with patient as needed.

## 2013-05-01 NOTE — Progress Notes (Signed)
Derrick [name] here to have EMLA cream applied to his port site prior to getting IV fluids.  His port is located on his right chest.  He had an old band-aid on the bottom of his port that he wished to have removed.  Took off band-aid.  There was a small red area.  Patient stated that it is not painful.  Applied EMLA cream over port area and covered with Tegaderm.

## 2013-05-01 NOTE — Patient Instructions (Signed)
Dehydration, Adult Dehydration is when you lose more fluids from the body than you take in. Vital organs like the kidneys, brain, and heart cannot function without a proper amount of fluids and salt. Any loss of fluids from the body can cause dehydration.  CAUSES   Vomiting.  Diarrhea.  Excessive sweating.  Excessive urine output.  Fever. SYMPTOMS  Mild dehydration  Thirst.  Dry lips.  Slightly dry mouth. Moderate dehydration  Very dry mouth.  Sunken eyes.  Skin does not bounce back quickly when lightly pinched and released.  Dark urine and decreased urine production.  Decreased tear production.  Headache. Severe dehydration  Very dry mouth.  Extreme thirst.  Rapid, weak pulse (more than 100 beats per minute at rest).  Cold hands and feet.  Not able to sweat in spite of heat and temperature.  Rapid breathing.  Blue lips.  Confusion and lethargy.  Difficulty being awakened.  Minimal urine production.  No tears. DIAGNOSIS  Your caregiver will diagnose dehydration based on your symptoms and your exam. Blood and urine tests will help confirm the diagnosis. The diagnostic evaluation should also identify the cause of dehydration. TREATMENT  Treatment of mild or moderate dehydration can often be done at home by increasing the amount of fluids that you drink. It is best to drink small amounts of fluid more often. Drinking too much at one time can make vomiting worse. Refer to the home care instructions below. Severe dehydration needs to be treated at the hospital where you will probably be given intravenous (IV) fluids that contain water and electrolytes. HOME CARE INSTRUCTIONS   Ask your caregiver about specific rehydration instructions.  Drink enough fluids to keep your urine clear or pale yellow.  Drink small amounts frequently if you have nausea and vomiting.  Eat as you normally do.  Avoid:  Foods or drinks high in sugar.  Carbonated  drinks.  Juice.  Extremely hot or cold fluids.  Drinks with caffeine.  Fatty, greasy foods.  Alcohol.  Tobacco.  Overeating.  Gelatin desserts.  Wash your hands well to avoid spreading bacteria and viruses.  Only take over-the-counter or prescription medicines for pain, discomfort, or fever as directed by your caregiver.  Ask your caregiver if you should continue all prescribed and over-the-counter medicines.  Keep all follow-up appointments with your caregiver. SEEK MEDICAL CARE IF:  You have abdominal pain and it increases or stays in one area (localizes).  You have a rash, stiff neck, or severe headache.  You are irritable, sleepy, or difficult to awaken.  You are weak, dizzy, or extremely thirsty. SEEK IMMEDIATE MEDICAL CARE IF:   You are unable to keep fluids down or you get worse despite treatment.  You have frequent episodes of vomiting or diarrhea.  You have blood or green matter (bile) in your vomit.  You have blood in your stool or your stool looks black and tarry.  You have not urinated in 6 to 8 hours, or you have only urinated a small amount of very dark urine.  You have a fever.  You faint. MAKE SURE YOU:   Understand these instructions.  Will watch your condition.  Will get help right away if you are not doing well or get worse. Document Released: 05/16/2005 Document Revised: 08/08/2011 Document Reviewed: 01/03/2011 Exit[employer] Patient Information 2014 [employer].  

## 2013-05-01 NOTE — Progress Notes (Unsigned)
Met with patient and his wife during scheduled fluid infusion.  Patient agreed with wife's statement that he has been feeling much better since yesterday, particularly no N&V, minimal thickened saliva.  She attributes the absence of N&V to the Reglan which was started yesterday.  [facility] delivered suction equipment yesterday but he has had no need for it thus far.  Continuing to navigate as L1 (new) patient.  Young Berry, RN, BSN, [facility] Navigator [phone]

## 2013-05-02 ENCOUNTER — Ambulatory Visit: Payer: Medicare Other | Admitting: Psychiatry

## 2013-05-02 ENCOUNTER — Encounter: Payer: Self-pay | Admitting: Radiation Oncology

## 2013-05-02 ENCOUNTER — Ambulatory Visit
Admission: RE | Admit: 2013-05-02 | Discharge: 2013-05-02 | Disposition: A | Discharge status: Home / Self Care | Payer: Medicare Other | Source: Ambulatory Visit | Attending: Radiation Oncology | Admitting: Radiation Oncology

## 2013-05-02 ENCOUNTER — Telehealth: Payer: Self-pay | Admitting: *Deleted

## 2013-05-02 ENCOUNTER — Other Ambulatory Visit: Payer: Self-pay | Admitting: *Deleted

## 2013-05-02 ENCOUNTER — Other Ambulatory Visit: Payer: Self-pay | Admitting: Hematology and Oncology

## 2013-05-02 ENCOUNTER — Ambulatory Visit (HOSPITAL_BASED_OUTPATIENT_CLINIC_OR_DEPARTMENT_OTHER): Payer: Medicare Other

## 2013-05-02 VITALS — BP 138/73 | HR 87 | Temp 98.3°F | Resp 18

## 2013-05-02 VITALS — BP 109/70 | HR 103 | Temp 99.8°F | Ht 69.0 in | Wt 141.0 lb

## 2013-05-02 DIAGNOSIS — C100 Malignant neoplasm of vallecula: Secondary | ICD-10-CM

## 2013-05-02 DIAGNOSIS — L089 Local infection of the skin and subcutaneous tissue, unspecified: Secondary | ICD-10-CM

## 2013-05-02 DIAGNOSIS — E860 Dehydration: Secondary | ICD-10-CM

## 2013-05-02 MED ORDER — SODIUM CHLORIDE 0.9 % IJ SOLN
10.0000 mL | INTRAMUSCULAR | Status: DC | PRN
  Administered 2013-05-02: 10 mL via INTRAVENOUS
  Filled 2013-05-02: qty 10

## 2013-05-02 MED ORDER — CEFDINIR 250 MG/5ML PO SUSR: 250.0000 mg | Freq: Two times a day (BID) | ORAL | Status: AC

## 2013-05-02 MED ORDER — HEPARIN SOD (PORK) LOCK FLUSH 100 UNIT/ML IV SOLN
500.0000 [IU] | Freq: Once | INTRAVENOUS | Status: AC
  Administered 2013-05-02: 500 [IU] via INTRAVENOUS
  Filled 2013-05-02: qty 5

## 2013-05-02 MED ORDER — SODIUM CHLORIDE 0.9 % IV SOLN
Freq: Once | INTRAVENOUS | Status: AC
  Administered 2013-05-02: 500 mL via INTRAVENOUS

## 2013-05-02 NOTE — Progress Notes (Signed)
Weekly Management Note Current Dose: 40 Gy  Projected Dose:70 Gy   Narrative:  The patient presents for routine under treatment assessment.  CBCT/MVCT images/Port film x-rays were reviewed.  The chart was checked. Much improved 6 cans ysterday. Secretions less think. No nausea.  Would like fluids tomorrow. Some constipation but BM yesterday AM. Pain well controlled.  Physical Findings:  Appears more alert.   Vitals:  Filed Vitals:   05/02/13 0915  BP: 109/70  Pulse: 103  Temp:    Weight:  Wt Readings from Last 3 Encounters:  05/02/13 141 lb (63.957 kg)  04/30/13 138 lb 9.6 oz (62.869 kg)  04/30/13 139 lb 6.4 oz (63.231 kg)   Lab Results  Component Value Date   WBC 6.3 04/30/2013   HGB 12.7* 04/30/2013   HCT 37.4* 04/30/2013   MCV 97.0 04/30/2013   PLT 302 04/30/2013   Lab Results  Component Value Date   CREATININE 0.9 04/30/2013   BUN 15.9 04/30/2013   NA 129* 04/30/2013   K 4.8 04/30/2013   CL 98 04/02/2013   CO2 26 04/30/2013     Impression:  The patient is tolerating radiation.  Plan:  Continue treatment as planned. Continue reglan. Fluids scheduled for tomorrow. Hold off today as he is doing better.

## 2013-05-02 NOTE — Patient Instructions (Signed)
Dehydration, Adult  Dehydration means your body does not have as much fluid as it needs. Your kidneys, brain, and heart will not work properly without the right amount of fluids and salt.   HOME CARE   Ask your doctor how to replace body fluid losses (rehydrate).   Drink enough fluids to keep your pee (urine) clear or pale yellow.   Drink small amounts of fluids often if you feel sick to your stomach (nauseous) or throw up (vomit).   Eat like you normally do.   Avoid:   Foods or drinks high in sugar.   Bubbly (carbonated) drinks.   Juice.   Very hot or cold fluids.   Drinks with caffeine.   Fatty, greasy foods.   Alcohol.   Tobacco.   Eating too much.   Gelatin desserts.   Wash your hands to avoid spreading germs (bacteria, viruses).   Only take medicine as told by your doctor.   Keep all doctor visits as told.  GET HELP RIGHT AWAY IF:    You cannot drink something without throwing up.   You get worse even with treatment.   Your vomit has blood in it or looks greenish.   Your poop (stool) has blood in it or looks black and tarry.   You have not peed in 6 to 8 hours.   You pee a small amount of very dark pee.   You have a fever.   You pass out (faint).   You have belly (abdominal) pain that gets worse or stays in one spot (localizes).   You have a rash, stiff neck, or bad headache.   You get easily annoyed, sleepy, or are hard to wake up.   You feel weak, dizzy, or very thirsty.  MAKE SURE YOU:    Understand these instructions.   Will watch your condition.   Will get help right away if you are not doing well or get worse.  Document Released: 03/12/2009 Document Revised: 08/08/2011 Document Reviewed: 01/03/2011  [employer] Patient Information 2014 [employer].

## 2013-05-02 NOTE — Telephone Encounter (Signed)
Open by misake 

## 2013-05-02 NOTE — Progress Notes (Signed)
Mr. Schwieger has gained ~ 2.5 lbs since Monday.  He is no longer nauseated and was able to tolerate 6 cans of ensure via peg on yesterday.  He denies any pain.  Continues to appear very fatigued and is drowsy at this time.  BP improved without any orthostasis.  He spouse reports.  He will receive IVF hydration on 12/5 and 12/6.

## 2013-05-03 ENCOUNTER — Telehealth: Payer: Self-pay | Admitting: Family Medicine

## 2013-05-03 ENCOUNTER — Encounter: Payer: Self-pay | Admitting: *Deleted

## 2013-05-03 ENCOUNTER — Ambulatory Visit (HOSPITAL_BASED_OUTPATIENT_CLINIC_OR_DEPARTMENT_OTHER): Payer: Medicare Other

## 2013-05-03 ENCOUNTER — Ambulatory Visit
Admission: RE | Admit: 2013-05-03 | Discharge: 2013-05-03 | Disposition: A | Discharge status: Home / Self Care | Payer: Medicare Other | Source: Ambulatory Visit | Attending: Radiation Oncology | Admitting: Radiation Oncology

## 2013-05-03 ENCOUNTER — Ambulatory Visit: Payer: Medicare Other | Admitting: Nutrition

## 2013-05-03 ENCOUNTER — Other Ambulatory Visit: Payer: Self-pay | Admitting: Hematology and Oncology

## 2013-05-03 DIAGNOSIS — R112 Nausea with vomiting, unspecified: Secondary | ICD-10-CM

## 2013-05-03 DIAGNOSIS — R110 Nausea: Secondary | ICD-10-CM | POA: Insufficient documentation

## 2013-05-03 DIAGNOSIS — C100 Malignant neoplasm of vallecula: Secondary | ICD-10-CM

## 2013-05-03 MED ORDER — SODIUM CHLORIDE 0.9 % IV SOLN
Freq: Once | INTRAVENOUS | Status: AC
  Administered 2013-05-03: 10:00:00 via INTRAVENOUS

## 2013-05-03 MED ORDER — ONDANSETRON HCL 40 MG/20ML IJ SOLN
4.0000 mg | Freq: Once | INTRAMUSCULAR | Status: AC
  Administered 2013-05-03: 4 mg via INTRAVENOUS
  Filled 2013-05-03: qty 2

## 2013-05-03 NOTE — Patient Instructions (Signed)
Dehydration, Adult Dehydration is when you lose more fluids from the body than you take in. Vital organs like the kidneys, brain, and heart cannot function without a proper amount of fluids and salt. Any loss of fluids from the body can cause dehydration.  CAUSES   Vomiting.  Diarrhea.  Excessive sweating.  Excessive urine output.  Fever. SYMPTOMS  Mild dehydration  Thirst.  Dry lips.  Slightly dry mouth. Moderate dehydration  Very dry mouth.  Sunken eyes.  Skin does not bounce back quickly when lightly pinched and released.  Dark urine and decreased urine production.  Decreased tear production.  Headache. Severe dehydration  Very dry mouth.  Extreme thirst.  Rapid, weak pulse (more than 100 beats per minute at rest).  Cold hands and feet.  Not able to sweat in spite of heat and temperature.  Rapid breathing.  Blue lips.  Confusion and lethargy.  Difficulty being awakened.  Minimal urine production.  No tears. DIAGNOSIS  Your caregiver will diagnose dehydration based on your symptoms and your exam. Blood and urine tests will help confirm the diagnosis. The diagnostic evaluation should also identify the cause of dehydration. TREATMENT  Treatment of mild or moderate dehydration can often be done at home by increasing the amount of fluids that you drink. It is best to drink small amounts of fluid more often. Drinking too much at one time can make vomiting worse. Refer to the home care instructions below. Severe dehydration needs to be treated at the hospital where you will probably be given intravenous (IV) fluids that contain water and electrolytes. HOME CARE INSTRUCTIONS   Ask your caregiver about specific rehydration instructions.  Drink enough fluids to keep your urine clear or pale yellow.  Drink small amounts frequently if you have nausea and vomiting.  Eat as you normally do.  Avoid:  Foods or drinks high in sugar.  Carbonated  drinks.  Juice.  Extremely hot or cold fluids.  Drinks with caffeine.  Fatty, greasy foods.  Alcohol.  Tobacco.  Overeating.  Gelatin desserts.  Wash your hands well to avoid spreading bacteria and viruses.  Only take over-the-counter or prescription medicines for pain, discomfort, or fever as directed by your caregiver.  Ask your caregiver if you should continue all prescribed and over-the-counter medicines.  Keep all follow-up appointments with your caregiver. SEEK MEDICAL CARE IF:  You have abdominal pain and it increases or stays in one area (localizes).  You have a rash, stiff neck, or severe headache.  You are irritable, sleepy, or difficult to awaken.  You are weak, dizzy, or extremely thirsty. SEEK IMMEDIATE MEDICAL CARE IF:   You are unable to keep fluids down or you get worse despite treatment.  You have frequent episodes of vomiting or diarrhea.  You have blood or green matter (bile) in your vomit.  You have blood in your stool or your stool looks black and tarry.  You have not urinated in 6 to 8 hours, or you have only urinated a small amount of very dark urine.  You have a fever.  You faint. MAKE SURE YOU:   Understand these instructions.  Will watch your condition.  Will get help right away if you are not doing well or get worse. Document Released: 05/16/2005 Document Revised: 08/08/2011 Document Reviewed: 01/03/2011 Exit[employer] Patient Information 2014 [employer].  

## 2013-05-03 NOTE — Progress Notes (Signed)
Wife gave tube feeding while pt received IVF.  Tol. Well.

## 2013-05-03 NOTE — Progress Notes (Signed)
Visited patient and his wife to provide support and encouragement while patient receiving fluid infusion.  Wife reported that patient continues to not have episodes of N&V; reported that he is "overall doing better".  Continuing to navigate as L2 (treatments established) patient.  Young Berry, RN, BSN, [facility] Navigator [phone]

## 2013-05-03 NOTE — Progress Notes (Signed)
Visited with patient in chemotherapy.  He reports feeling fatigued.  However, he is tolerating Osmolite 1.2 at goal rate to meet 100% of estimated nutrition needs.  Patient reports nausea and vomiting is resolved.  Weight is up slightly and was documented as 141 pounds.  Patient and wife deny questions regarding nutrition support at this time.  He is to continue present nutrition schedule along with medications as prescribed.  They will contact me with questions or concerns and I will try to touch base with them next week.

## 2013-05-04 ENCOUNTER — Other Ambulatory Visit: Payer: Self-pay | Admitting: *Deleted

## 2013-05-06 ENCOUNTER — Ambulatory Visit: Payer: Medicare Other

## 2013-05-06 ENCOUNTER — Encounter: Payer: Self-pay | Admitting: *Deleted

## 2013-05-06 ENCOUNTER — Ambulatory Visit (HOSPITAL_BASED_OUTPATIENT_CLINIC_OR_DEPARTMENT_OTHER): Payer: Medicare Other | Admitting: Hematology and Oncology

## 2013-05-06 ENCOUNTER — Other Ambulatory Visit: Payer: Self-pay | Admitting: Hematology and Oncology

## 2013-05-06 ENCOUNTER — Encounter: Payer: Self-pay | Admitting: Radiation Oncology

## 2013-05-06 ENCOUNTER — Other Ambulatory Visit: Payer: Self-pay | Admitting: *Deleted

## 2013-05-06 ENCOUNTER — Ambulatory Visit: Payer: Medicare Other | Attending: Radiation Oncology

## 2013-05-06 ENCOUNTER — Encounter: Payer: Self-pay | Admitting: Hematology and Oncology

## 2013-05-06 ENCOUNTER — Ambulatory Visit: Payer: Medicare Other | Admitting: Nutrition

## 2013-05-06 ENCOUNTER — Ambulatory Visit
Admission: RE | Admit: 2013-05-06 | Discharge: 2013-05-06 | Disposition: A | Discharge status: Home / Self Care | Payer: Medicare Other | Source: Ambulatory Visit | Attending: Radiation Oncology | Admitting: Radiation Oncology

## 2013-05-06 ENCOUNTER — Other Ambulatory Visit (HOSPITAL_BASED_OUTPATIENT_CLINIC_OR_DEPARTMENT_OTHER): Payer: Medicare Other | Admitting: *Deleted

## 2013-05-06 DIAGNOSIS — R070 Pain in throat: Secondary | ICD-10-CM

## 2013-05-06 DIAGNOSIS — R112 Nausea with vomiting, unspecified: Secondary | ICD-10-CM

## 2013-05-06 DIAGNOSIS — R110 Nausea: Secondary | ICD-10-CM

## 2013-05-06 DIAGNOSIS — E860 Dehydration: Secondary | ICD-10-CM

## 2013-05-06 DIAGNOSIS — C100 Malignant neoplasm of vallecula: Secondary | ICD-10-CM

## 2013-05-06 DIAGNOSIS — IMO0001 Reserved for inherently not codable concepts without codable children: Secondary | ICD-10-CM | POA: Insufficient documentation

## 2013-05-06 DIAGNOSIS — R1310 Dysphagia, unspecified: Secondary | ICD-10-CM | POA: Insufficient documentation

## 2013-05-06 MED ORDER — ONDANSETRON 8 MG/50ML IVPB (CHCC)
8.0000 mg | Freq: Every day | INTRAVENOUS | Status: DC
  Administered 2013-05-06: 8 mg via INTRAVENOUS

## 2013-05-06 MED ORDER — ONDANSETRON 8 MG/NS 50 ML IVPB
INTRAVENOUS | Status: AC
  Filled 2013-05-06: qty 8

## 2013-05-06 MED ORDER — HEPARIN SOD (PORK) LOCK FLUSH 100 UNIT/ML IV SOLN
500.0000 [IU] | Freq: Once | INTRAVENOUS | Status: AC
  Administered 2013-05-06: 500 [IU] via INTRAVENOUS
  Filled 2013-05-06: qty 5

## 2013-05-06 MED ORDER — SODIUM CHLORIDE 0.9 % IV SOLN
Freq: Every day | INTRAVENOUS | Status: DC
  Administered 2013-05-06: 10:00:00 via INTRAVENOUS

## 2013-05-06 MED ORDER — SODIUM CHLORIDE 0.9 % IJ SOLN
10.0000 mL | Freq: Once | INTRAMUSCULAR | Status: AC
  Administered 2013-05-06: 10 mL via INTRAVENOUS
  Filled 2013-05-06: qty 10

## 2013-05-06 NOTE — Progress Notes (Signed)
Brief followup with patient and his wife.  Patient had one episode of vomiting on Saturday.  He only received 2 cans of oral nutrition supplements both Saturday and Sunday.  Patient's wife reports she did not want patient to continue to throw up so she did not administer feedings.  Patient tolerated 1-1/2 cans of Osmolite 1.2. this morning in the chemotherapy room while receiving IV fluids.  Last weight documented as 141 pounds on December 4.  Nutrition diagnosis: Food and nutrition related knowledge deficit and inadequate oral intake.  Both continue.  Intervention: Recommended patient resume Osmolite 1.2 using 1-1/2 cans 4 times a day as previously tolerated.  Patient's wife verbalizes understanding and agrees to comply.  Monitoring, evaluation, goals: Patient will tolerate bolus tube feeding to meet greater than 90% of estimated needs to minimize weight loss.  Next visit: I will continue to follow patient throughout treatment.

## 2013-05-06 NOTE — Progress Notes (Signed)
Discharged at 12:40 via wheelchair with spouse to speech therapy appointment.  Denies any problems and no distress noted.

## 2013-05-06 NOTE — Patient Instructions (Signed)
Nausea and Vomiting Nausea is a sick feeling that often comes before throwing up (vomiting). Vomiting is a reflex where stomach contents come out of your mouth. Vomiting can cause severe loss of body fluids (dehydration). Children and elderly adults can become dehydrated quickly, especially if they also have diarrhea. Nausea and vomiting are symptoms of a condition or disease. It is important to find the cause of your symptoms. CAUSES   Direct irritation of the stomach lining. This irritation can result from increased acid production (gastroesophageal reflux disease), infection, food poisoning, taking certain medicines (such as nonsteroidal anti-inflammatory drugs), alcohol use, or tobacco use.  Signals from the brain.These signals could be caused by a headache, heat exposure, an inner ear disturbance, increased pressure in the brain from injury, infection, a tumor, or a concussion, pain, emotional stimulus, or metabolic problems.  An obstruction in the gastrointestinal tract (bowel obstruction).  Illnesses such as diabetes, hepatitis, gallbladder problems, appendicitis, kidney problems, cancer, sepsis, atypical symptoms of a heart attack, or eating disorders.  Medical treatments such as chemotherapy and radiation.  Receiving medicine that makes you sleep (general anesthetic) during surgery. DIAGNOSIS Your caregiver may ask for tests to be done if the problems do not improve after a few days. Tests may also be done if symptoms are severe or if the reason for the nausea and vomiting is not clear. Tests may include:  Urine tests.  Blood tests.  Stool tests.  Cultures (to look for evidence of infection).  X-rays or other imaging studies. Test results can help your caregiver make decisions about treatment or the need for additional tests. TREATMENT You need to stay well hydrated. Drink frequently but in small amounts.You may wish to drink water, sports drinks, clear broth, or eat frozen  ice pops or gelatin dessert to help stay hydrated.When you eat, eating slowly may help prevent nausea.There are also some antinausea medicines that may help prevent nausea. HOME CARE INSTRUCTIONS   Take all medicine as directed by your caregiver.  If you do not have an appetite, do not force yourself to eat. However, you must continue to drink fluids.  If you have an appetite, eat a normal diet unless your caregiver tells you differently.  Eat a variety of complex carbohydrates (rice, wheat, potatoes, bread), lean meats, yogurt, fruits, and vegetables.  Avoid high-fat foods because they are more difficult to digest.  Drink enough water and fluids to keep your urine clear or pale yellow.  If you are dehydrated, ask your caregiver for specific rehydration instructions. Signs of dehydration may include:  Severe thirst.  Dry lips and mouth.  Dizziness.  Dark urine.  Decreasing urine frequency and amount.  Confusion.  Rapid breathing or pulse. SEEK IMMEDIATE MEDICAL CARE IF:   You have blood or brown flecks (like coffee grounds) in your vomit.  You have black or bloody stools.  You have a severe headache or stiff neck.  You are confused.  You have severe abdominal pain.  You have chest pain or trouble breathing.  You do not urinate at least once every 8 hours.  You develop cold or clammy skin.  You continue to vomit for longer than 24 to 48 hours.  You have a fever. MAKE SURE YOU:   Understand these instructions.  Will watch your condition.  Will get help right away if you are not doing well or get worse. Document Released: 05/16/2005 Document Revised: 08/08/2011 Document Reviewed: 10/13/2010 ExitCare Patient Information 2014 [employer].  Dehydration, Adult Dehydration is   when you lose more fluids from the body than you take in. Vital organs like the kidneys, brain, and heart cannot function without a proper amount of fluids and salt. Any loss of fluids  from the body can cause dehydration.  CAUSES   Vomiting.  Diarrhea.  Excessive sweating.  Excessive urine output.  Fever. SYMPTOMS  Mild dehydration  Thirst.  Dry lips.  Slightly dry mouth. Moderate dehydration  Very dry mouth.  Sunken eyes.  Skin does not bounce back quickly when lightly pinched and released.  Dark urine and decreased urine production.  Decreased tear production.  Headache. Severe dehydration  Very dry mouth.  Extreme thirst.  Rapid, weak pulse (more than 100 beats per minute at rest).  Cold hands and feet.  Not able to sweat in spite of heat and temperature.  Rapid breathing.  Blue lips.  Confusion and lethargy.  Difficulty being awakened.  Minimal urine production.  No tears. DIAGNOSIS  Your caregiver will diagnose dehydration based on your symptoms and your exam. Blood and urine tests will help confirm the diagnosis. The diagnostic evaluation should also identify the cause of dehydration. TREATMENT  Treatment of mild or moderate dehydration can often be done at home by increasing the amount of fluids that you drink. It is best to drink small amounts of fluid more often. Drinking too much at one time can make vomiting worse. Refer to the home care instructions below. Severe dehydration needs to be treated at the hospital where you will probably be given intravenous (IV) fluids that contain water and electrolytes. HOME CARE INSTRUCTIONS   Ask your caregiver about specific rehydration instructions.  Drink enough fluids to keep your urine clear or pale yellow.  Drink small amounts frequently if you have nausea and vomiting.  Eat as you normally do.  Avoid:  Foods or drinks high in sugar.  Carbonated drinks.  Juice.  Extremely hot or cold fluids.  Drinks with caffeine.  Fatty, greasy foods.  Alcohol.  Tobacco.  Overeating.  Gelatin desserts.  Wash your hands well to avoid spreading bacteria and  viruses.  Only take over-the-counter or prescription medicines for pain, discomfort, or fever as directed by your caregiver.  Ask your caregiver if you should continue all prescribed and over-the-counter medicines.  Keep all follow-up appointments with your caregiver. SEEK MEDICAL CARE IF:  You have abdominal pain and it increases or stays in one area (localizes).  You have a rash, stiff neck, or severe headache.  You are irritable, sleepy, or difficult to awaken.  You are weak, dizzy, or extremely thirsty. SEEK IMMEDIATE MEDICAL CARE IF:   You are unable to keep fluids down or you get worse despite treatment.  You have frequent episodes of vomiting or diarrhea.  You have blood or green matter (bile) in your vomit.  You have blood in your stool or your stool looks black and tarry.  You have not urinated in 6 to 8 hours, or you have only urinated a small amount of very dark urine.  You have a fever.  You faint. MAKE SURE YOU:   Understand these instructions.  Will watch your condition.  Will get help right away if you are not doing well or get worse. Document Released: 05/16/2005 Document Revised: 08/08/2011 Document Reviewed: 01/03/2011 Exit[employer] Patient Information 2014 [employer].  

## 2013-05-06 NOTE — Progress Notes (Signed)
[facility] OFFICE PROGRESS NOTE  Patient Care Team: [name], MD as PCP - General (Family Medicine) Dennis Bast, RN as Registered Nurse (Oncology) Artis Delay, MD as Consulting Physician (Hematology and Oncology)  DIAGNOSIS: Squamous cell carcinoma of the vallecula, ongoing treatment and supportive care  SUMMARY OF ONCOLOGIC HISTORY: Oncology History   Cancer of vallecula   Primary site: Pharynx - Oropharynx   Staging method: AJCC 7th Edition   Clinical: Stage IVA (T2, N2c, M0) signed by Lonie Peak, MD on 03/15/2013  5:35 PM   Summary: Stage IVA (T2, N2c, M0)      Cancer of vallecula   02/22/2013 Imaging Ct scan of neck showed abnormal lymphadenopathy   03/08/2013 Surgery Laryngoscopy and biopsy confirmed SCC of vallecula   03/20/2013 Imaging PET/CT scan confirmed cancer at the base of tongue and regional lympadenopathy   04/03/2013 -  Radiation Therapy Start of XRT   04/03/2013 -  Chemotherapy cycle 1 of weekly Erbitux.    04/05/2013 Procedure Placement of infusaport & feeding tube   04/17/2013 Adverse Reaction Chemo was on hold due to grade 2-3 skin toxicity and mucositis   04/30/2013 Adverse Reaction Discontinued chemotherapy due to significant side effects    INTERVAL HISTORY: Derrick [name] 79 y.o. male returns for further followup. The patient was seen at the infusion center due to his disability. Last week, due to some discharge around the feeding tube site, he was prescribed antibiotics. His wife is still noticing some discharge around the feeding tube but it's improved. He is able to tolerate 6 cans of nutritional feeding on most days. He had significant nausea and vomiting on Saturday but has not vomited since then. He still struggles eith significant mucous production of the back of his throat and that triggers vomiting. His skin rash is improving. He denies any recent fever, chills, night sweats or abnormal weight loss  I have reviewed  the past medical history, past surgical history, social history and family history with the patient and they are unchanged from previous note.  ALLERGIES:  is allergic to aspirin.  MEDICATIONS:  Current Outpatient Prescriptions  Medication Sig Dispense Refill  . cefdinir (OMNICEF) 250 MG/5ML suspension Take 5 mLs (250 mg total) by mouth 2 (two) times daily.  100 mL  0  . clindamycin (CLINDAGEL) 1 % gel Apply topically 2 (two) times daily.  60 g  0  . clopidogrel (PLAVIX) 75 MG tablet Take 75 mg by mouth.      . fentaNYL (DURAGESIC - DOSED MCG/HR) 12 MCG/HR Place 1 patch (12.5 mcg total) onto the skin every 3 (three) days.  5 patch  0  . lidocaine-prilocaine (EMLA) cream Apply 1 application topically as needed (For port-a-cath. one hour prior to needle stick).       . metoCLOPramide (REGLAN) 5 MG/5ML solution Place 10 mLs (10 mg total) into feeding tube 4 (four) times daily -  before meals and at bedtime.  480 mL  1  . Nutritional Supplements (FEEDING SUPPLEMENT, OSMOLITE 1.2 CAL,) LIQD Change Osmolite 1.2 to 1.5 cans QID with 60 ml free water before and after each bolus feeding.  In addition, drink or flush feeding tube with an additional 240 ml free water.  1422 mL  0  . ondansetron (ZOFRAN) 8 MG tablet Take 1 tablet (8 mg total) by mouth every 8 (eight) hours as needed for nausea.  30 tablet  3  . oxyCODONE-acetaminophen (PERCOCET/ROXICET) 5-325 MG per tablet Take 1-2 tablets  by mouth every 4 (four) hours as needed for moderate pain.  30 tablet  0  . PARoxetine (PAXIL) 10 MG tablet Take 1 tablet (10 mg total) by mouth daily.  30 tablet  1  . polyethylene glycol (MIRALAX / GLYCOLAX) packet Take 17 g by mouth daily.  14 each  1  . PRESCRIPTION MEDICATION Place 1 application into both eyes at bedtime. Sochlor (Sodium Chloride) 5% Ointment      . sodium chloride (MURO 128) 5 % ophthalmic solution Place 1 drop into both eyes every morning.      . sodium fluoride (PREVIDENT 5000 PLUS) 1.1 % CREA  dental cream Place 1 application onto teeth at bedtime. Apply fluoride to toothbrush. Brush for 2 minutes. Spit out excess. Do not rinse. Repeat nightly.      . promethazine (PHENERGAN) 25 MG suppository Place 1 suppository (25 mg total) rectally 2 (two) times daily as needed for nausea (if unable to swallow oral anti-emetics).  10 suppository  1  . promethazine (PHENERGAN) 25 MG tablet Take 1 tablet (25 mg total) by mouth every 6 (six) hours as needed for nausea.  30 tablet  3  . propranolol (INDERAL) 20 MG tablet Take 20 mg by mouth.      . simvastatin (ZOCOR) 20 MG tablet Take 20 mg by mouth.       No current facility-administered medications for this visit.   Facility-Administered Medications Ordered in Other Visits  Medication Dose Route Frequency Provider Last Rate Last Dose  . 0.9 %  sodium chloride infusion   Intravenous Daily Artis Delay, MD 500 mL/hr at 05/06/13 0945    . ondansetron (ZOFRAN) IVPB 8 mg  8 mg Intravenous Daily Artis Delay, MD   8 mg at 05/06/13 1020    REVIEW OF SYSTEMS:   Constitutional: Denies fevers, chills or abnormal weight loss Eyes: Denies blurriness of vision Respiratory: Denies cough, dyspnea or wheezes Cardiovascular: Denies palpitation, chest discomfort or lower extremity swelling Lymphatics: Denies new lymphadenopathy or easy bruising Neurological:Denies numbness, tingling or new weaknesses Behavioral/Psych: Mood is stable, no new changes  All other systems were reviewed with the patient and are negative.  PHYSICAL EXAMINATION: ECOG PERFORMANCE STATUS: 2 - Symptomatic, <50% confined to bed GENERAL:alert, no distress and comfortable SKIN: skin color, texture, turgor are normal, still had persistent skin rash around his neck in his face EYES: normal, Conjunctiva are pink and non-injected, sclera clear OROPHARYNX:no exudate, noted erythema at the back of his throat, no thrush NECK: supple, thyroid normal size, non-tender, without nodularity LYMPH:  no  palpable lymphadenopathy in the cervical, axillary or inguinal LUNGS: clear to auscultation and percussion with normal breathing effort HEART: regular rate & rhythm and no murmurs and no lower extremity edema ABDOMEN:abdomen soft, non-tender the feeding tube site looks okay with some minimum redness around the tube, improve compared to previous evaluation last week.  Musculoskeletal:no cyanosis of digits and no clubbing  NEURO: alert & oriented x 3 with fluent speech, no focal motor/sensory deficits  LABORATORY DATA:  I have reviewed the data as listed    Component Value Date/Time   NA 129* 04/30/2013 1026   NA 136 04/02/2013 1445   K 4.8 04/30/2013 1026   K 4.9 04/02/2013 1445   CL 98 04/02/2013 1445   CO2 26 04/30/2013 1026   CO2 30 04/02/2013 1445   GLUCOSE 132 04/30/2013 1026   GLUCOSE 91 04/02/2013 1445   BUN 15.9 04/30/2013 1026   BUN 17 04/02/2013 1445  CREATININE 0.9 04/30/2013 1026   CREATININE 1.08 04/02/2013 1445   CREATININE 1.01 02/08/2013 0951   CALCIUM 9.4 04/30/2013 1026   CALCIUM 10.2 04/02/2013 1445   PROT 7.2 04/30/2013 1026   PROT 7.9 04/02/2013 1445   ALBUMIN 2.7* 04/30/2013 1026   ALBUMIN 3.8 04/02/2013 1445   AST 96* 04/30/2013 1026   AST 24 04/02/2013 1445   ALT 145* 04/30/2013 1026   ALT 12 04/02/2013 1445   ALKPHOS 85 04/30/2013 1026   ALKPHOS 57 04/02/2013 1445   BILITOT 0.63 04/30/2013 1026   BILITOT 0.5 04/02/2013 1445   GFRNONAA 59* 04/02/2013 1445   GFRAA 69* 04/02/2013 1445    No results found for this basename: SPEP, UPEP,  kappa and lambda light chains    Lab Results  Component Value Date   WBC 6.3 04/30/2013   NEUTROABS 4.2 04/30/2013   HGB 12.7* 04/30/2013   HCT 37.4* 04/30/2013   MCV 97.0 04/30/2013   PLT 302 04/30/2013      Chemistry      Component Value Date/Time   NA 129* 04/30/2013 1026   NA 136 04/02/2013 1445   K 4.8 04/30/2013 1026   K 4.9 04/02/2013 1445   CL 98 04/02/2013 1445   CO2 26 04/30/2013 1026   CO2 30 04/02/2013 1445   BUN 15.9 04/30/2013  1026   BUN 17 04/02/2013 1445   CREATININE 0.9 04/30/2013 1026   CREATININE 1.08 04/02/2013 1445   CREATININE 1.01 02/08/2013 0951      Component Value Date/Time   CALCIUM 9.4 04/30/2013 1026   CALCIUM 10.2 04/02/2013 1445   ALKPHOS 85 04/30/2013 1026   ALKPHOS 57 04/02/2013 1445   AST 96* 04/30/2013 1026   AST 24 04/02/2013 1445   ALT 145* 04/30/2013 1026   ALT 12 04/02/2013 1445   BILITOT 0.63 04/30/2013 1026   BILITOT 0.5 04/02/2013 1445      ASSESSMENT & PLAN:  #1 squamous cell carcinoma of the vallecula We will continue up to provide aggressive supportive care. He will continue to get radiation therapy. Chemotherapy has been discontinued. I will continue to schedule IV fluids daily for him Monday to Friday every week until his radiation is completed #2 infection around feeding tube site He will continue antibiotic treatment. This is improving. #3 nausea, vomiting and dehydration We will continue IV fluids. He will also get IV Zofran on a daily basis. He will continue Reglan before each feeding #4 throat pain He will continue fentanyl patch and when necessary oxycodone All questions were answered. The patient knows to call the clinic with any problems, questions or concerns. No barriers to learning was detected. I spent 25 minutes counseling the patient face to face. The total time spent in the appointment was 40 minutes and more than 50% was on counseling and review of test results     [facility], Lolamae Voisin, MD 05/06/2013 12:31 PM

## 2013-05-07 ENCOUNTER — Other Ambulatory Visit (HOSPITAL_BASED_OUTPATIENT_CLINIC_OR_DEPARTMENT_OTHER): Payer: Medicare Other

## 2013-05-07 ENCOUNTER — Ambulatory Visit
Admission: RE | Admit: 2013-05-07 | Discharge: 2013-05-07 | Disposition: A | Discharge status: Home / Self Care | Payer: Medicare Other | Source: Ambulatory Visit | Attending: Radiation Oncology | Admitting: Radiation Oncology

## 2013-05-07 ENCOUNTER — Encounter: Payer: Self-pay | Admitting: Radiation Oncology

## 2013-05-07 ENCOUNTER — Ambulatory Visit: Admission: RE | Admit: 2013-05-07 | Payer: Medicare Other | Source: Ambulatory Visit

## 2013-05-07 ENCOUNTER — Ambulatory Visit (HOSPITAL_BASED_OUTPATIENT_CLINIC_OR_DEPARTMENT_OTHER): Payer: Medicare Other

## 2013-05-07 ENCOUNTER — Ambulatory Visit: Payer: Self-pay | Admitting: Psychiatry

## 2013-05-07 VITALS — BP 120/67 | HR 99 | Temp 99.8°F | Ht 69.0 in | Wt 145.2 lb

## 2013-05-07 VITALS — BP 112/65 | HR 92 | Temp 99.0°F | Resp 20

## 2013-05-07 DIAGNOSIS — C100 Malignant neoplasm of vallecula: Secondary | ICD-10-CM

## 2013-05-07 DIAGNOSIS — E860 Dehydration: Secondary | ICD-10-CM

## 2013-05-07 LAB — CBC WITH DIFFERENTIAL/PLATELET
BASO%: 0.7 % (ref 0.0–2.0)
Basophils Absolute: 0 10*3/uL (ref 0.0–0.1)
EOS%: 1 % (ref 0.0–7.0)
HGB: 12.1 g/dL — ABNORMAL LOW (ref 13.0–17.1)
LYMPH%: 11.4 % — ABNORMAL LOW (ref 14.0–49.0)
MCH: 32.6 pg (ref 27.2–33.4)
MCHC: 33.6 g/dL (ref 32.0–36.0)
MONO#: 1.4 10*3/uL — ABNORMAL HIGH (ref 0.1–0.9)
MONO%: 20.7 % — ABNORMAL HIGH (ref 0.0–14.0)
Platelets: 306 10*3/uL (ref 140–400)
RBC: 3.72 10*6/uL — ABNORMAL LOW (ref 4.20–5.82)
WBC: 7 10*3/uL (ref 4.0–10.3)
lymph#: 0.8 10*3/uL — ABNORMAL LOW (ref 0.9–3.3)

## 2013-05-07 LAB — COMPREHENSIVE METABOLIC PANEL (CC13)
ALT: 74 U/L — ABNORMAL HIGH (ref 0–55)
AST: 44 U/L — ABNORMAL HIGH (ref 5–34)
Alkaline Phosphatase: 78 U/L (ref 40–150)
Anion Gap: 9 mEq/L (ref 3–11)
CO2: 26 mEq/L (ref 22–29)
Sodium: 129 mEq/L — ABNORMAL LOW (ref 136–145)
Total Bilirubin: 0.59 mg/dL (ref 0.20–1.20)
Total Protein: 6.8 g/dL (ref 6.4–8.3)

## 2013-05-07 MED ORDER — HEPARIN SOD (PORK) LOCK FLUSH 100 UNIT/ML IV SOLN
500.0000 [IU] | Freq: Once | INTRAVENOUS | Status: AC
  Administered 2013-05-07: 500 [IU] via INTRAVENOUS
  Filled 2013-05-07: qty 5

## 2013-05-07 MED ORDER — ONDANSETRON 8 MG/50ML IVPB (CHCC)
8.0000 mg | Freq: Every day | INTRAVENOUS | Status: DC
  Administered 2013-05-07: 8 mg via INTRAVENOUS

## 2013-05-07 MED ORDER — ONDANSETRON 8 MG/NS 50 ML IVPB
INTRAVENOUS | Status: AC
  Filled 2013-05-07: qty 8

## 2013-05-07 MED ORDER — SODIUM CHLORIDE 0.9 % IV SOLN
Freq: Every day | INTRAVENOUS | Status: DC
  Administered 2013-05-07: 10:00:00 via INTRAVENOUS

## 2013-05-07 MED ORDER — SODIUM CHLORIDE 0.9 % IJ SOLN
10.0000 mL | Freq: Once | INTRAMUSCULAR | Status: AC
  Administered 2013-05-07: 10 mL via INTRAVENOUS
  Filled 2013-05-07: qty 10

## 2013-05-07 MED ORDER — SCOPOLAMINE 1 MG/3DAYS TD PT72: 1.0000 | MEDICATED_PATCH | TRANSDERMAL | Status: AC

## 2013-05-07 NOTE — Progress Notes (Signed)
Met with patient and his wife during scheduled fluid infusion to provide support and encouragement.  Wife indicated patient had an episode of N&V on Saturday and wasn't able to take any supplements.  His condition improved some on Sunday but not to the point where he was able to manage 6 cans of supplement.  Wife noted that patient has finished his book and is now waiting notification from [place] re: availability.  Continuing to navigate as L2 (treatments established) patient.  Young Berry, RN, BSN, [facility] Head [facility] Neck Oncology Navigator [phone]

## 2013-05-07 NOTE — Progress Notes (Signed)
Weekly Management Note Current Dose:  44 Gy  Projected Dose: 70 Gy   Narrative:  The patient presents for routine under treatment assessment.  CBCT/MVCT images/Port film x-rays were reviewed.  The chart was checked. More secretions. Coughing leeads to dry heaves over the weekend. Down to 4 cans per day. Scheduled for fluids today. Not much pain.   Physical Findings: Weight: 145 lb 3.2 oz (65.862 kg). Pink skin bilaterally. Mucocitis in posterior oropharynx with no thrush.   Impression:  The patient is tolerating radiation.  Plan:  Continue treatment as planned. Add scopolamine patch. Encourage POs.

## 2013-05-07 NOTE — Patient Instructions (Signed)
Dehydration, Adult Dehydration is when you lose more fluids from the body than you take in. Vital organs like the kidneys, brain, and heart cannot function without a proper amount of fluids and salt. Any loss of fluids from the body can cause dehydration.  CAUSES   Vomiting.  Diarrhea.  Excessive sweating.  Excessive urine output.  Fever. SYMPTOMS  Mild dehydration  Thirst.  Dry lips.  Slightly dry mouth. Moderate dehydration  Very dry mouth.  Sunken eyes.  Skin does not bounce back quickly when lightly pinched and released.  Dark urine and decreased urine production.  Decreased tear production.  Headache. Severe dehydration  Very dry mouth.  Extreme thirst.  Rapid, weak pulse (more than 100 beats per minute at rest).  Cold hands and feet.  Not able to sweat in spite of heat and temperature.  Rapid breathing.  Blue lips.  Confusion and lethargy.  Difficulty being awakened.  Minimal urine production.  No tears. DIAGNOSIS  Your caregiver will diagnose dehydration based on your symptoms and your exam. Blood and urine tests will help confirm the diagnosis. The diagnostic evaluation should also identify the cause of dehydration. TREATMENT  Treatment of mild or moderate dehydration can often be done at home by increasing the amount of fluids that you drink. It is best to drink small amounts of fluid more often. Drinking too much at one time can make vomiting worse. Refer to the home care instructions below. Severe dehydration needs to be treated at the hospital where you will probably be given intravenous (IV) fluids that contain water and electrolytes. HOME CARE INSTRUCTIONS   Ask your caregiver about specific rehydration instructions.  Drink enough fluids to keep your urine clear or pale yellow.  Drink small amounts frequently if you have nausea and vomiting.  Eat as you normally do.  Avoid:  Foods or drinks high in sugar.  Carbonated  drinks.  Juice.  Extremely hot or cold fluids.  Drinks with caffeine.  Fatty, greasy foods.  Alcohol.  Tobacco.  Overeating.  Gelatin desserts.  Wash your hands well to avoid spreading bacteria and viruses.  Only take over-the-counter or prescription medicines for pain, discomfort, or fever as directed by your caregiver.  Ask your caregiver if you should continue all prescribed and over-the-counter medicines.  Keep all follow-up appointments with your caregiver. SEEK MEDICAL CARE IF:  You have abdominal pain and it increases or stays in one area (localizes).  You have a rash, stiff neck, or severe headache.  You are irritable, sleepy, or difficult to awaken.  You are weak, dizzy, or extremely thirsty. SEEK IMMEDIATE MEDICAL CARE IF:   You are unable to keep fluids down or you get worse despite treatment.  You have frequent episodes of vomiting or diarrhea.  You have blood or green matter (bile) in your vomit.  You have blood in your stool or your stool looks black and tarry.  You have not urinated in 6 to 8 hours, or you have only urinated a small amount of very dark urine.  You have a fever.  You faint. MAKE SURE YOU:   Understand these instructions.  Will watch your condition.  Will get help right away if you are not doing well or get worse. Document Released: 05/16/2005 Document Revised: 08/08/2011 Document Reviewed: 01/03/2011 Exit[employer] Patient Information 2014 [employer].  

## 2013-05-07 NOTE — Progress Notes (Addendum)
Mr. Derrick [name] has received 22 fractions to his Vallecular and bilateral neck.  Today he was unable to tolerate lying on the treatment table because he was gagging ,even though His spouse reports that there has been a decrease in the thickened saliva.  He appears very fatigued today and is presently dozing while sitting in a wheelchair.   His neck is reddened, but skin remains intact.  Given addtional Biafine.  He has a temp of 99.8.  He continues to receive daily IV fluids per Dr. Bertis Ruddy.      Vomiting on Saturday, therefore wife reduced feeding on Saturday and Sunday.  She plans to give feeding while hydration fluids are infusing.

## 2013-05-07 NOTE — Progress Notes (Signed)
Patient's wife performing tube feeding at this time without difficulty while patient receiving IVF and IV zofran.

## 2013-05-08 ENCOUNTER — Ambulatory Visit: Admission: RE | Admit: 2013-05-08 | Payer: Medicare Other | Source: Ambulatory Visit

## 2013-05-08 ENCOUNTER — Encounter (HOSPITAL_COMMUNITY): Payer: Self-pay | Admitting: Emergency Medicine

## 2013-05-08 ENCOUNTER — Emergency Department (HOSPITAL_COMMUNITY)
Admission: EM | Admit: 2013-05-08 | Discharge: 2013-05-30 | Disposition: E | Payer: Medicare Other | Attending: Emergency Medicine | Admitting: Emergency Medicine

## 2013-05-08 ENCOUNTER — Ambulatory Visit: Payer: Self-pay | Admitting: Hematology and Oncology

## 2013-05-08 ENCOUNTER — Other Ambulatory Visit: Payer: Self-pay | Admitting: Hematology and Oncology

## 2013-05-08 ENCOUNTER — Ambulatory Visit (HOSPITAL_BASED_OUTPATIENT_CLINIC_OR_DEPARTMENT_OTHER): Payer: Medicare Other

## 2013-05-08 ENCOUNTER — Other Ambulatory Visit: Payer: Self-pay | Admitting: *Deleted

## 2013-05-08 ENCOUNTER — Telehealth: Payer: Self-pay

## 2013-05-08 ENCOUNTER — Other Ambulatory Visit: Payer: Self-pay | Admitting: Radiation Oncology

## 2013-05-08 ENCOUNTER — Encounter: Payer: Self-pay | Admitting: *Deleted

## 2013-05-08 VITALS — BP 135/80 | HR 96 | Temp 98.0°F

## 2013-05-08 DIAGNOSIS — G250 Essential tremor: Secondary | ICD-10-CM | POA: Insufficient documentation

## 2013-05-08 DIAGNOSIS — E785 Hyperlipidemia, unspecified: Secondary | ICD-10-CM | POA: Insufficient documentation

## 2013-05-08 DIAGNOSIS — T71164A Asphyxiation due to hanging, undetermined, initial encounter: Secondary | ICD-10-CM

## 2013-05-08 DIAGNOSIS — R404 Transient alteration of awareness: Secondary | ICD-10-CM | POA: Insufficient documentation

## 2013-05-08 DIAGNOSIS — E860 Dehydration: Secondary | ICD-10-CM

## 2013-05-08 DIAGNOSIS — T71162A Asphyxiation due to hanging, intentional self-harm, initial encounter: Secondary | ICD-10-CM | POA: Insufficient documentation

## 2013-05-08 DIAGNOSIS — I469 Cardiac arrest, cause unspecified: Secondary | ICD-10-CM | POA: Insufficient documentation

## 2013-05-08 DIAGNOSIS — R599 Enlarged lymph nodes, unspecified: Secondary | ICD-10-CM | POA: Insufficient documentation

## 2013-05-08 DIAGNOSIS — Z9109 Other allergy status, other than to drugs and biological substances: Secondary | ICD-10-CM | POA: Insufficient documentation

## 2013-05-08 DIAGNOSIS — Z8673 Personal history of transient ischemic attack (TIA), and cerebral infarction without residual deficits: Secondary | ICD-10-CM | POA: Insufficient documentation

## 2013-05-08 DIAGNOSIS — Z9849 Cataract extraction status, unspecified eye: Secondary | ICD-10-CM | POA: Insufficient documentation

## 2013-05-08 DIAGNOSIS — C100 Malignant neoplasm of vallecula: Secondary | ICD-10-CM | POA: Insufficient documentation

## 2013-05-08 DIAGNOSIS — Z8739 Personal history of other diseases of the musculoskeletal system and connective tissue: Secondary | ICD-10-CM | POA: Insufficient documentation

## 2013-05-08 DIAGNOSIS — C4442 Squamous cell carcinoma of skin of scalp and neck: Secondary | ICD-10-CM | POA: Insufficient documentation

## 2013-05-08 DIAGNOSIS — R110 Nausea: Secondary | ICD-10-CM

## 2013-05-08 DIAGNOSIS — IMO0002 Reserved for concepts with insufficient information to code with codable children: Secondary | ICD-10-CM | POA: Insufficient documentation

## 2013-05-08 MED ORDER — LORAZEPAM 0.5 MG PO TABS: 0.5000 mg | ORAL_TABLET | Freq: Three times a day (TID) | ORAL | Status: AC

## 2013-05-08 MED ORDER — SODIUM CHLORIDE 0.9 % IV SOLN
1000.0000 mL | Freq: Once | INTRAVENOUS | Status: AC
  Administered 2013-05-08: 1000 mL via INTRAVENOUS

## 2013-05-08 MED ORDER — SODIUM CHLORIDE 0.9 % IJ SOLN
10.0000 mL | INTRAMUSCULAR | Status: DC | PRN
  Administered 2013-05-08: 10 mL via INTRAVENOUS
  Filled 2013-05-08: qty 10

## 2013-05-08 MED ORDER — ONDANSETRON 8 MG/50ML IVPB (CHCC)
8.0000 mg | Freq: Every day | INTRAVENOUS | Status: DC
  Administered 2013-05-08: 8 mg via INTRAVENOUS

## 2013-05-08 MED ORDER — HEPARIN SOD (PORK) LOCK FLUSH 100 UNIT/ML IV SOLN
500.0000 [IU] | Freq: Once | INTRAVENOUS | Status: AC
  Administered 2013-05-08: 500 [IU] via INTRAVENOUS
  Filled 2013-05-08: qty 5

## 2013-05-08 MED ORDER — ONDANSETRON 8 MG/NS 50 ML IVPB
INTRAVENOUS | Status: AC
  Filled 2013-05-08: qty 8

## 2013-05-08 NOTE — Progress Notes (Signed)
Patient brought to nursing stating he didn't want radiation treatment today and felt claustrophobic.Scopaline patch will not be available until 5:00 pm tonight.Lorazepam called into [facility].Informed to take lorazepam prior to leaving home so he will be ready for treatment.

## 2013-05-08 NOTE — ED Notes (Signed)
Belongings brown bagged and give to [employer]

## 2013-05-08 NOTE — ED Notes (Signed)
EDP talked to ME and family. Family at beside with RN and social work.

## 2013-05-08 NOTE — Progress Notes (Signed)
Pt. came into the ED as level 1, Hanging- CPR after being found by wife at home. Pt. was pronounced dead shortly after arrival. Escorted  Pt.'s wife and daughter to consultation to speak with doctor. Social workers  assisted with support to family. Provided emotional and grief support. Promoted information sharing between family and staff. Other family members are expected. Past on to Chaplain for continued support until family departs.   05-18-13 1600  Clinical Encounter Type  Visited With Family;Patient and family together;Health care provider  Visit Type Social support;Death;ED;Trauma  Referral From Nurse  Spiritual Encounters  Spiritual Needs Emotional;Grief support  Stress Factors  Family Stress Factors Exhausted;Loss  Fae Pippin, Pager [phone]

## 2013-05-08 NOTE — ED Notes (Addendum)
Pt found by wife hanging by bathrobe. 7 mins estimated downtime. EMS found pt asystole upon arrival intubated by EMS and started cpr; 6 rounds of epi given in 18 in R AC. EMS got a rhythm back in route and then pt asystole again. Pt asystole upon arrive to hospital. Samuel Bouche discontinued in ER and pt's rhythm asystole. Pt pronounced dead by EDP at 1557.

## 2013-05-08 NOTE — ED Notes (Addendum)
CSI at bedside.

## 2013-05-08 NOTE — Telephone Encounter (Signed)
Started to call but was interrupted and did not call

## 2013-05-08 NOTE — ED Notes (Signed)
2 hearing aids, watch, and wedding band removed and given to wife.

## 2013-05-08 NOTE — ED Provider Notes (Signed)
CSN: [id number]     Arrival date & time May 10, 2013  1555 History   First MD Initiated Contact with Patient 05/10/2013 1601     Chief Complaint  Patient presents with  . Respiratory Arrest   (Consider location/radiation/quality/duration/timing/severity/associated sxs/prior Treatment) HPI Comments: 79 year old male arrives status Corine Solorio hanging attempt. Per EMS report patient was left in his room by wife for approximately 7 minutes. When she returned to room patient had hung himself with bathrobe. On EMS arrival patient was pulseless and apneic. CPR was started. Patient initially noted to have asystole. Was given one dose of epinephrine and regain pulses briefly. King airway was in place and transported begun. In route patient lost pulses noted to be in PEA. Multiple rounds of epinephrine with no return of spontaneous repletion. Total CPR time in route 30-40 minutes  Patient is a 79 y.o. male presenting with trauma.  Trauma Mechanism of injury: hanging Injury location: head/neck Injury location detail: neck Incident location: home. Arrived directly from scene: yes   EMS/PTA data:      Bystander interventions: chest compressions and rescue breathing      Responsiveness: unresponsive      Loss of consciousness: yes      Airway interventions: king airway.      Reason for intubation: airway protection and respiratory support  Current symptoms:      Associated symptoms:            Reports loss of consciousness.    Past Medical History  Diagnosis Date  . Cancer     throat (per EMS)   No past surgical history on file. No family history on file. History  Substance Use Topics  . Smoking status: Not on file  . Smokeless tobacco: Not on file  . Alcohol Use: Not on file    Review of Systems  Unable to perform ROS: Acuity of condition  Neurological: Positive for loss of consciousness.    Allergies  Review of patient's allergies indicates not on file.  Home Medications  No current  outpatient prescriptions on file. BP 0/0  Pulse 0  Resp 0  SpO2 100% Physical Exam  Vitals reviewed. Constitutional: He appears distressed.  CPR in progress   HENT:  Head: Normocephalic.  Eyes:  Fixed and dilated  Neck: No tracheal deviation present.  Linear abrasions on lateral neck consistent with hanging   Cardiovascular:  CPR in progress on arrival.  Pulmonary/Chest: He is in respiratory distress.  King airway in place. Significant blood in airway.  Abdominal: He exhibits no distension.  Musculoskeletal:  Linear abrasions on neck only sign of trauma or injury on exam  Neurological: He is unresponsive. GCS eye subscore is 1. GCS verbal subscore is 1. GCS motor subscore is 1.  Skin:  Cold, cyanotic  Psychiatric:  Unable to assess secondary to acuity of condition    ED Course  Procedures (including critical care time) Labs Review Labs Reviewed - No data to display Imaging Review No results found.  EKG Interpretation   None       MDM   1. Hanging, initial encounter   2. Cardiac arrest     79 year old male status Delonda Coley present suicide attempt by hanging. On arrival CPR in progress. Total downtime prior to arrival at least 40 minutes with at least 30 of CPR. On arrival pt moved to ED stretcher and pulse check. Pt in asystole, no pulses noted in carotid or femoral. Secondary to extended duration CPR, multiple co morbidities including stage 4  throat cancer/eldery etc determined pt with no chance for meaningful recovery. CPR was stopped the patient was pronounced dead at 15:57. Chaplain was called and met with family. Family was updated on situation and escorted to see patient. Medical examiner was called and accepted case.     Bridgett Larsson, MD 2013/05/23 [phone]

## 2013-05-08 NOTE — Patient Instructions (Signed)
Dehydration, Adult Dehydration is when you lose more fluids from the body than you take in. Vital organs like the kidneys, brain, and heart cannot function without a proper amount of fluids and salt. Any loss of fluids from the body can cause dehydration.  CAUSES   Vomiting.  Diarrhea.  Excessive sweating.  Excessive urine output.  Fever. SYMPTOMS  Mild dehydration  Thirst.  Dry lips.  Slightly dry mouth. Moderate dehydration  Very dry mouth.  Sunken eyes.  Skin does not bounce back quickly when lightly pinched and released.  Dark urine and decreased urine production.  Decreased tear production.  Headache. Severe dehydration  Very dry mouth.  Extreme thirst.  Rapid, weak pulse (more than 100 beats per minute at rest).  Cold hands and feet.  Not able to sweat in spite of heat and temperature.  Rapid breathing.  Blue lips.  Confusion and lethargy.  Difficulty being awakened.  Minimal urine production.  No tears. DIAGNOSIS  Your caregiver will diagnose dehydration based on your symptoms and your exam. Blood and urine tests will help confirm the diagnosis. The diagnostic evaluation should also identify the cause of dehydration. TREATMENT  Treatment of mild or moderate dehydration can often be done at home by increasing the amount of fluids that you drink. It is best to drink small amounts of fluid more often. Drinking too much at one time can make vomiting worse. Refer to the home care instructions below. Severe dehydration needs to be treated at the hospital where you will probably be given intravenous (IV) fluids that contain water and electrolytes. HOME CARE INSTRUCTIONS   Ask your caregiver about specific rehydration instructions.  Drink enough fluids to keep your urine clear or pale yellow.  Drink small amounts frequently if you have nausea and vomiting.  Eat as you normally do.  Avoid:  Foods or drinks high in sugar.  Carbonated  drinks.  Juice.  Extremely hot or cold fluids.  Drinks with caffeine.  Fatty, greasy foods.  Alcohol.  Tobacco.  Overeating.  Gelatin desserts.  Wash your hands well to avoid spreading bacteria and viruses.  Only take over-the-counter or prescription medicines for pain, discomfort, or fever as directed by your caregiver.  Ask your caregiver if you should continue all prescribed and over-the-counter medicines.  Keep all follow-up appointments with your caregiver. SEEK MEDICAL CARE IF:  You have abdominal pain and it increases or stays in one area (localizes).  You have a rash, stiff neck, or severe headache.  You are irritable, sleepy, or difficult to awaken.  You are weak, dizzy, or extremely thirsty. SEEK IMMEDIATE MEDICAL CARE IF:   You are unable to keep fluids down or you get worse despite treatment.  You have frequent episodes of vomiting or diarrhea.  You have blood or green matter (bile) in your vomit.  You have blood in your stool or your stool looks black and tarry.  You have not urinated in 6 to 8 hours, or you have only urinated a small amount of very dark urine.  You have a fever.  You faint. MAKE SURE YOU:   Understand these instructions.  Will watch your condition.  Will get help right away if you are not doing well or get worse. Document Released: 05/16/2005 Document Revised: 08/08/2011 Document Reviewed: 01/03/2011 Exit[employer] Patient Information 2014 [employer].  

## 2013-05-08 NOTE — ED Notes (Signed)
Pt placement called and notified pt transported to morgue.

## 2013-05-08 NOTE — Telephone Encounter (Signed)
L[facility]azepam called into [facility] per electronic script  today.

## 2013-05-09 ENCOUNTER — Telehealth: Payer: Self-pay | Admitting: *Deleted

## 2013-05-09 ENCOUNTER — Other Ambulatory Visit: Payer: Self-pay | Admitting: Hematology and Oncology

## 2013-05-09 ENCOUNTER — Other Ambulatory Visit: Payer: Self-pay | Admitting: *Deleted

## 2013-05-09 ENCOUNTER — Encounter: Payer: Self-pay | Admitting: *Deleted

## 2013-05-09 ENCOUNTER — Ambulatory Visit: Payer: Self-pay

## 2013-05-09 ENCOUNTER — Ambulatory Visit: Payer: Medicare Other

## 2013-05-09 ENCOUNTER — Encounter: Payer: Self-pay | Admitting: Radiation Oncology

## 2013-05-09 DIAGNOSIS — R110 Nausea: Secondary | ICD-10-CM

## 2013-05-09 MED ORDER — ONDANSETRON 8 MG/NS 50 ML IVPB
INTRAVENOUS | Status: AC
  Filled 2013-05-09: qty 8

## 2013-05-09 NOTE — ED Provider Notes (Signed)
I saw and evaluated the patient, reviewed the resident's note and I agree with the findings and plan.  EKG Interpretation   None       Patient pronounced shortly after arrival by myself and Dr. Lindie Spruce given prolonged down time in setting of trauma/hanging. Family counseled.  Audree Camel, MD 05/09/13 [phone]

## 2013-05-09 NOTE — Telephone Encounter (Signed)
In follow-up to wife's early call re: funeral home picking up Mr. Castille, I spoke with her

## 2013-05-09 NOTE — Progress Notes (Signed)
Discussed patient during weekly Multidisciplinary Support Team (nutritionist, SW, navigator) meeting.  Young Berry, RN, BSN, [facility] Ne[facility] Oncology Navigator [phone]

## 2013-05-09 NOTE — Progress Notes (Signed)
Patient scheduled for IVF today.  Requested orders from provider before patient arrival.  Noted at 9:23 an ED note for May 18, 2013.  Cancelled appointments for today and collaborative nurse notified of ED event.

## 2013-05-10 ENCOUNTER — Encounter: Payer: Self-pay | Admitting: Nutrition

## 2013-05-10 ENCOUNTER — Telehealth: Payer: Self-pay | Admitting: Family Medicine

## 2013-05-10 ENCOUNTER — Ambulatory Visit: Payer: Medicare Other

## 2013-05-10 ENCOUNTER — Ambulatory Visit: Payer: Self-pay

## 2013-05-10 NOTE — Telephone Encounter (Signed)
Tried to call patient's wife to offer condolences. No answer. I had spoken to her 2 days prior to his death, but his throat was sore to talk at the time.

## 2013-05-13 ENCOUNTER — Ambulatory Visit: Payer: Medicare Other

## 2013-05-13 ENCOUNTER — Ambulatory Visit: Payer: Self-pay | Admitting: Hematology and Oncology

## 2013-05-13 NOTE — Progress Notes (Signed)
  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  He unfortunately died during treatment and was unable to complete his prescribed course of treament.   ------------------------------------------------  Lurline Hare, MD

## 2013-05-14 ENCOUNTER — Ambulatory Visit: Payer: Medicare Other

## 2013-05-15 ENCOUNTER — Ambulatory Visit: Payer: Medicare Other

## 2013-05-16 ENCOUNTER — Ambulatory Visit: Payer: Medicare Other

## 2013-05-17 ENCOUNTER — Encounter: Payer: Self-pay | Admitting: *Deleted

## 2013-05-17 ENCOUNTER — Ambulatory Visit: Payer: Medicare Other

## 2013-05-17 NOTE — Progress Notes (Signed)
Call patient's wife to provide support, comfort.  LVM.  Young Berry, RN, BSN, [facility] Head & Neck Oncology Navigator [phone]

## 2013-05-20 ENCOUNTER — Telehealth: Payer: Self-pay | Admitting: *Deleted

## 2013-05-20 ENCOUNTER — Ambulatory Visit: Payer: Medicare Other

## 2013-05-20 NOTE — Telephone Encounter (Signed)
Spoke with patient's wife to provide support.  She expressed appreciation for card signed by staff, discussed funeral service, forthcoming trip.    Young Berry, RN, BSN, [facility] Neck Oncology Navigator [phone]

## 2013-05-21 ENCOUNTER — Ambulatory Visit: Payer: Medicare Other

## 2013-05-22 ENCOUNTER — Ambulatory Visit: Payer: Medicare Other

## 2013-05-24 ENCOUNTER — Ambulatory Visit: Payer: Medicare Other

## 2013-05-27 ENCOUNTER — Ambulatory Visit: Payer: Medicare Other

## 2013-05-28 ENCOUNTER — Ambulatory Visit: Payer: Medicare Other

## 2013-05-29 ENCOUNTER — Ambulatory Visit: Payer: Medicare Other

## 2013-05-30 DEATH — deceased

## 2013-06-24 ENCOUNTER — Ambulatory Visit (HOSPITAL_COMMUNITY): Payer: Self-pay | Admitting: Dentistry

## 2013-07-16 ENCOUNTER — Telehealth: Payer: Self-pay | Admitting: *Deleted

## 2013-07-16 NOTE — Telephone Encounter (Signed)
Called patient wife to see how she has been doing.  LVM with request to return my call.  Gayleen Orem, RN, BSN, [facility] Neck Oncology Navigator [phone]

## 2014-11-02 IMAGING — CR DG CHEST 2V
2 series · 2 of 2 positions shown · non-contrast
Comparison: 04/09/2012

CLINICAL DATA: Physical examination.

CHEST - 2 VIEW

[PA]
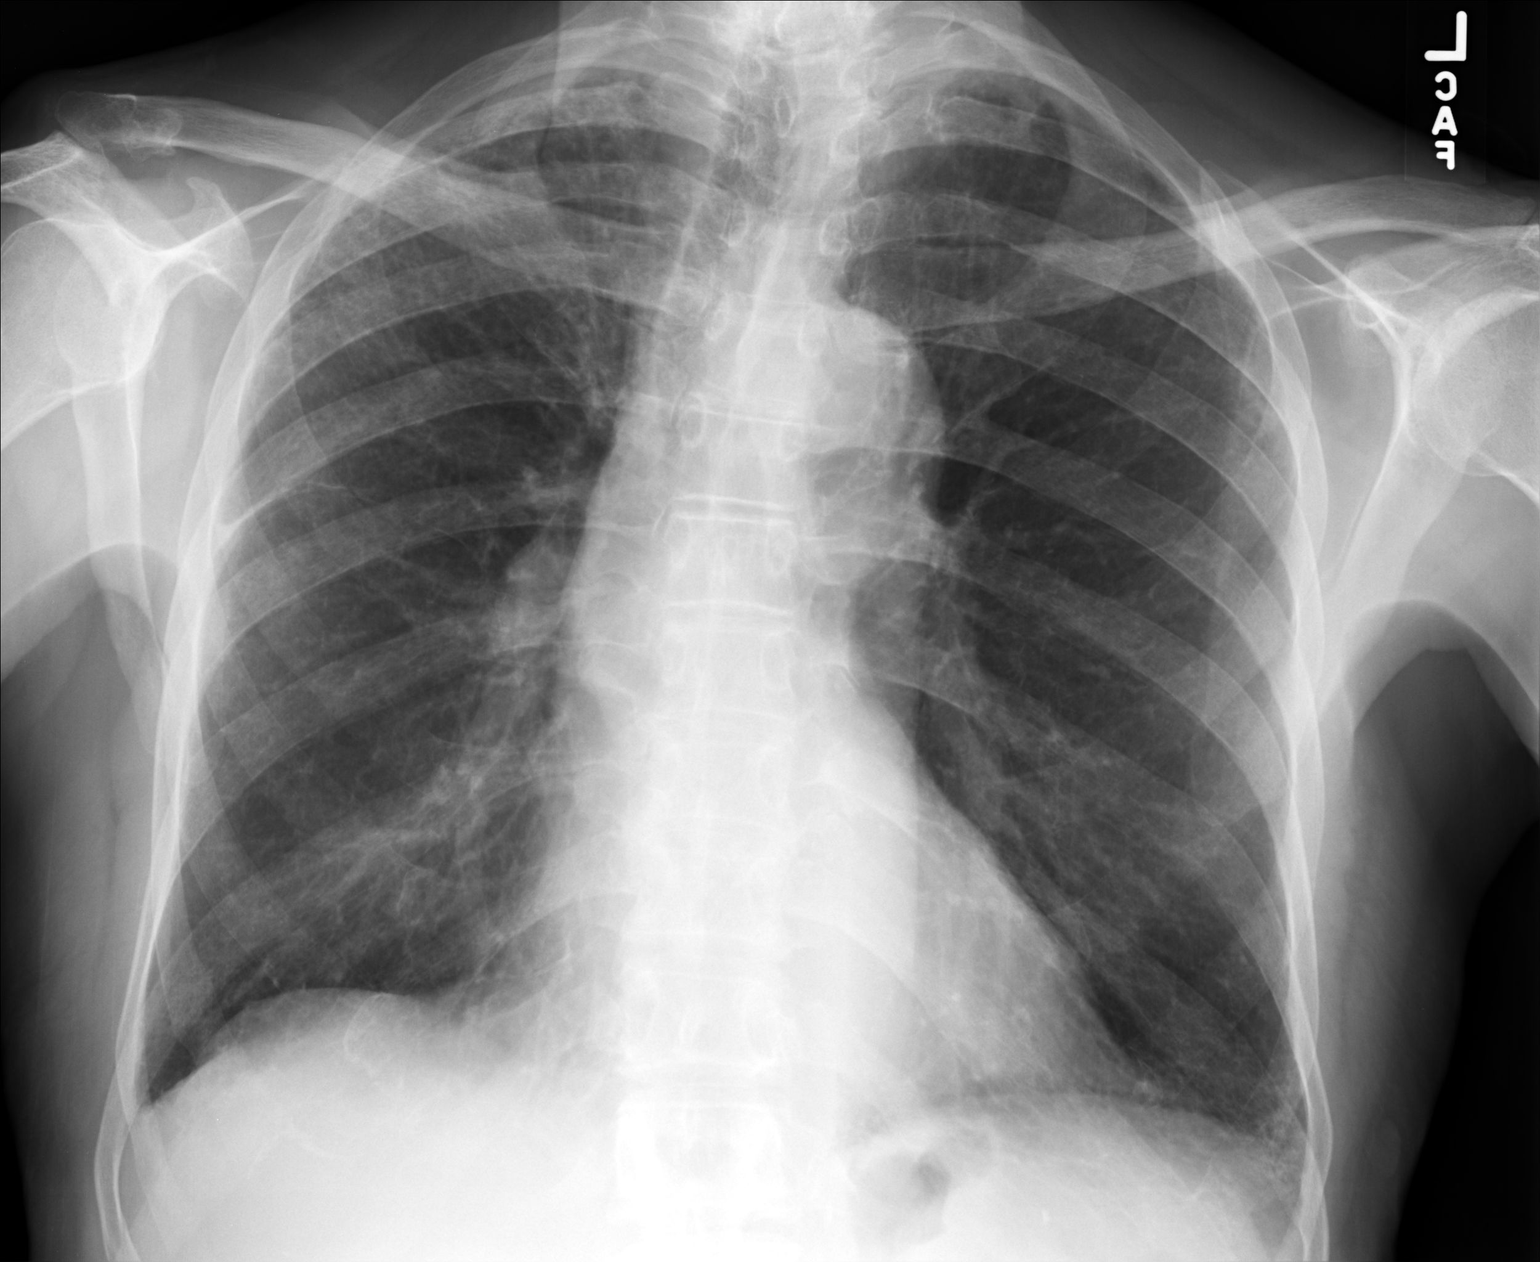

[lateral]
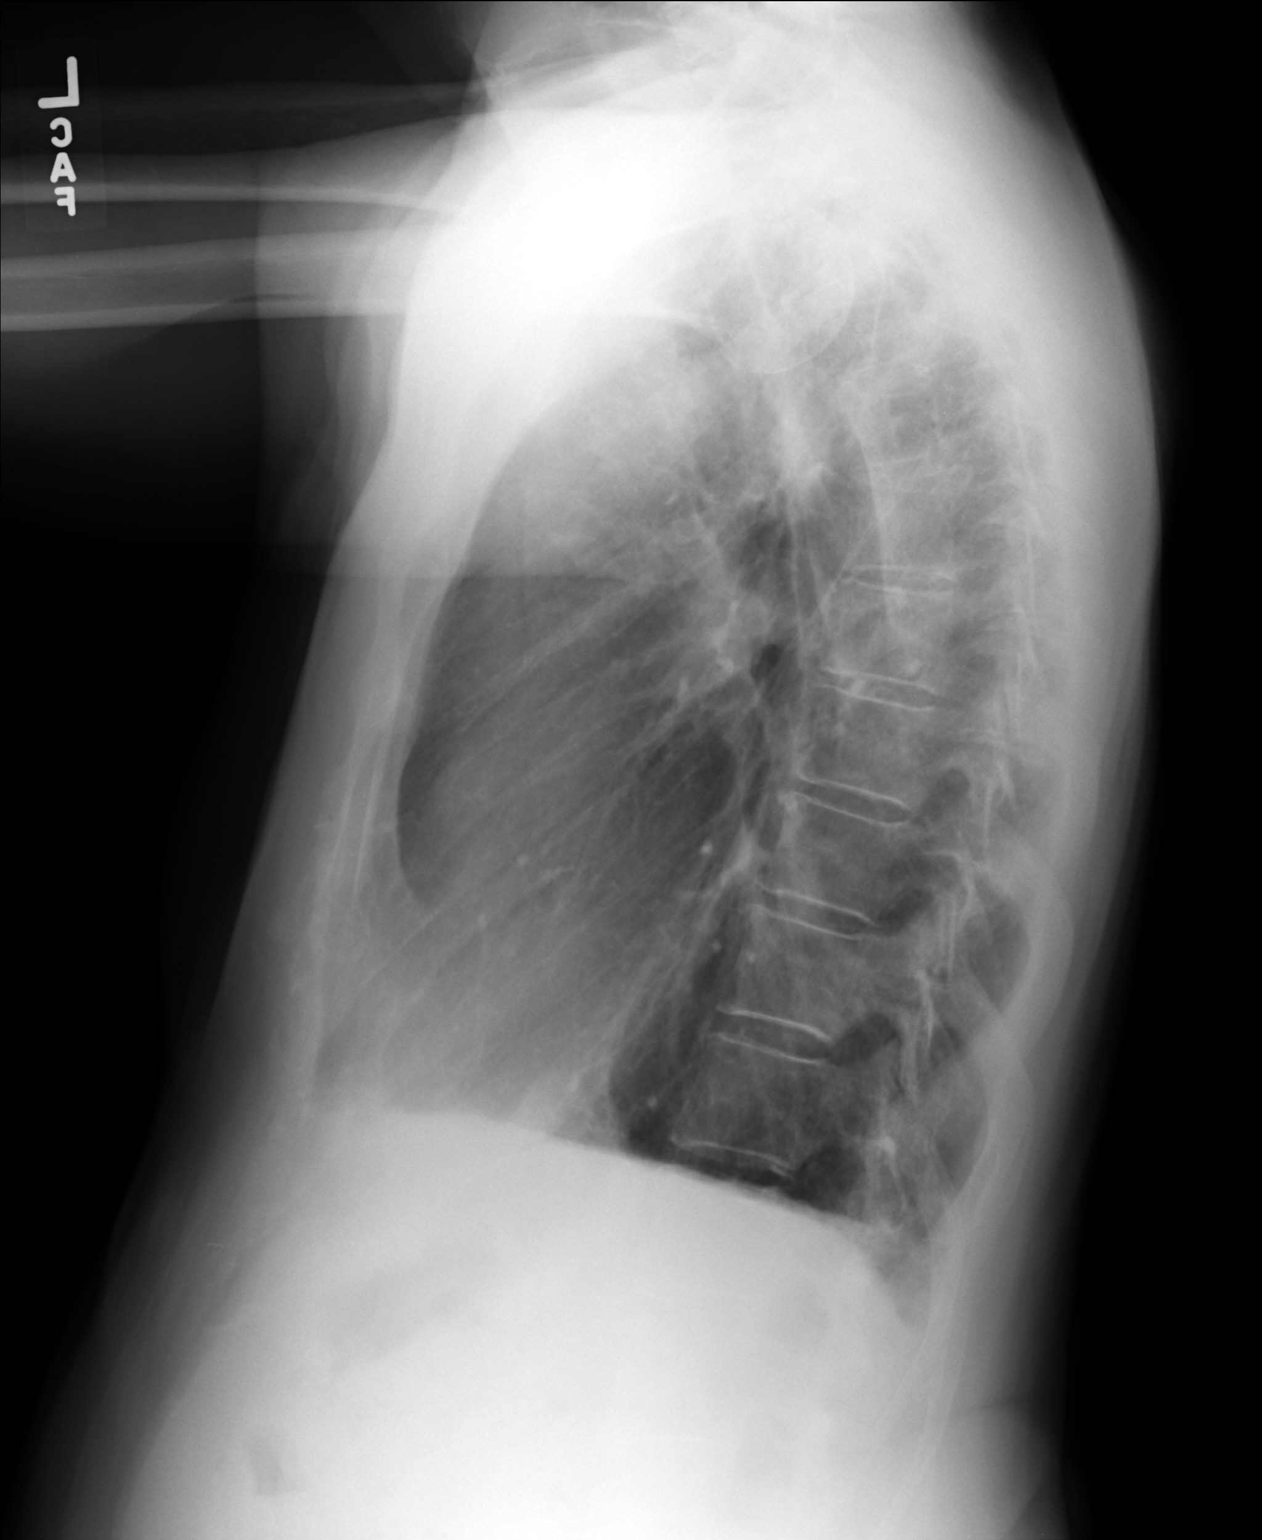

[2 of 2 positions shown; findings below may reference images not displayed]

FINDINGS: Two views of the chest demonstrate interstitial and
reticular markings in the right lung apex and to a lesser extent in
the left upper lung.  Suspect chronic changes at the left lung
base.  Heart and mediastinum are stable.  No evidence for pleural
effusions.  The bony thorax is intact.
IMPRESSION: Chronic changes at the lung apices, right side greater than left.
Findings are nonspecific but minimal change from previous
examination.

No acute chest findings.

## 2014-11-07 IMAGING — US US SOFT TISSUE HEAD/NECK
1 series · 14 of 25 positions shown · non-contrast
Comparison: None.

CLINICAL DATA: Left lower neck nodule

THYROID ULTRASOUND
TECHNIQUE: Ultrasound examination of the thyroid gland and adjacent
soft tissues was performed.

[Series 1: us soft tissue head/neck · 0.07mm/px · 14 of 42 slices shown]
[im 1/42]
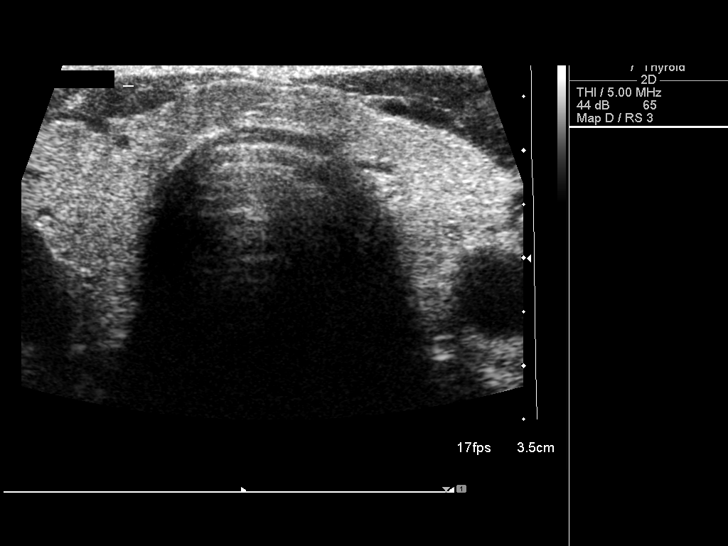
[im 4/42]
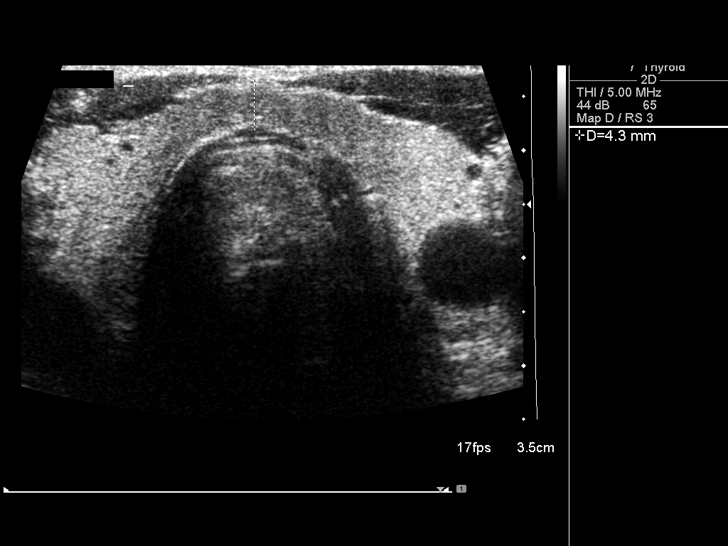
[im 7/42]
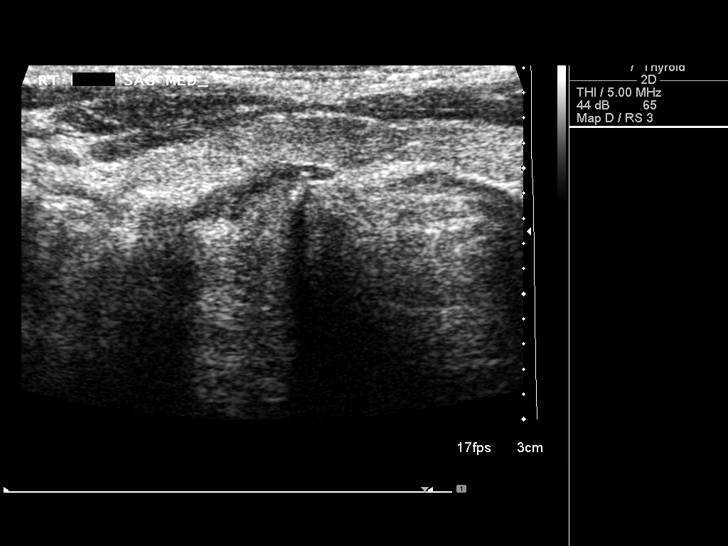
[im 11/42]
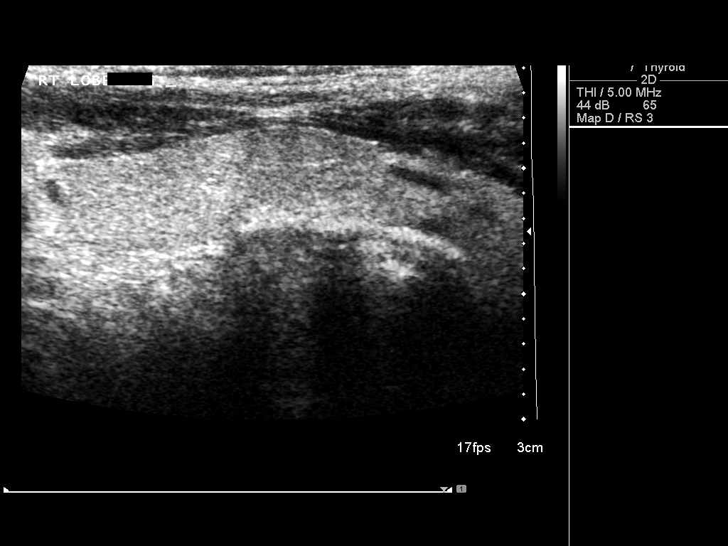
[im 14/42]
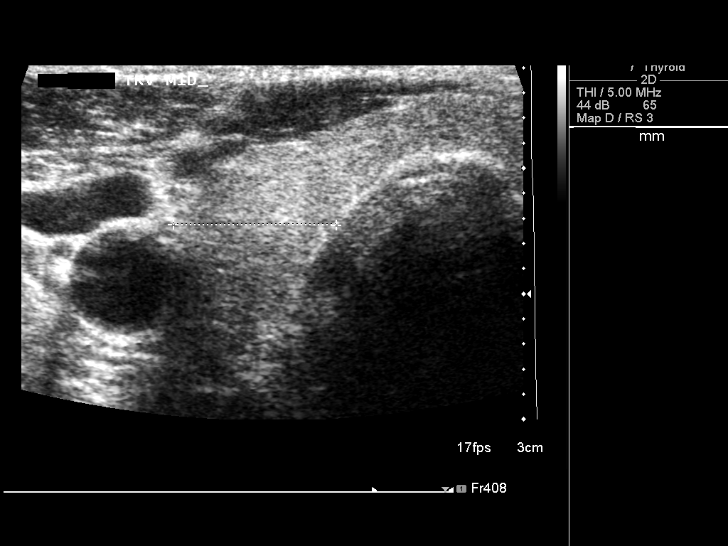
[im 16/42]
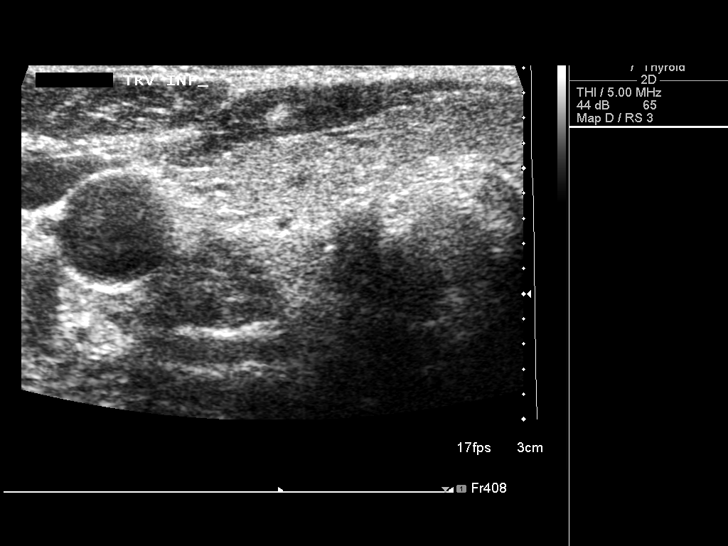
[im 19/42]
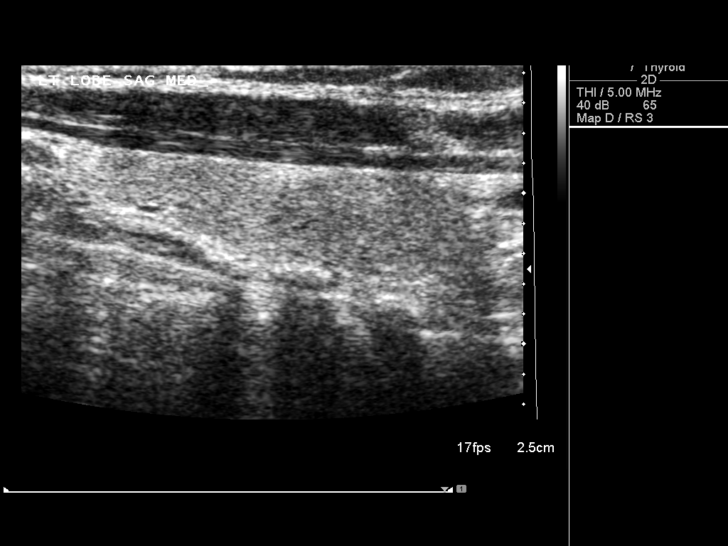
[im 23/42]
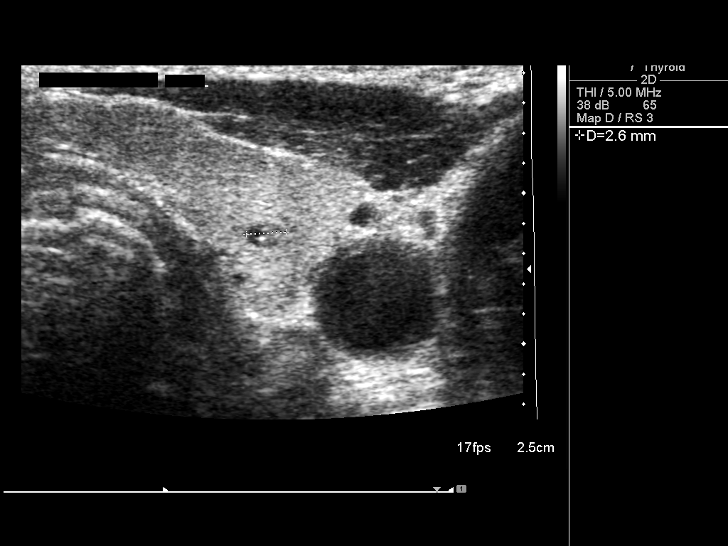
[im 26/42]
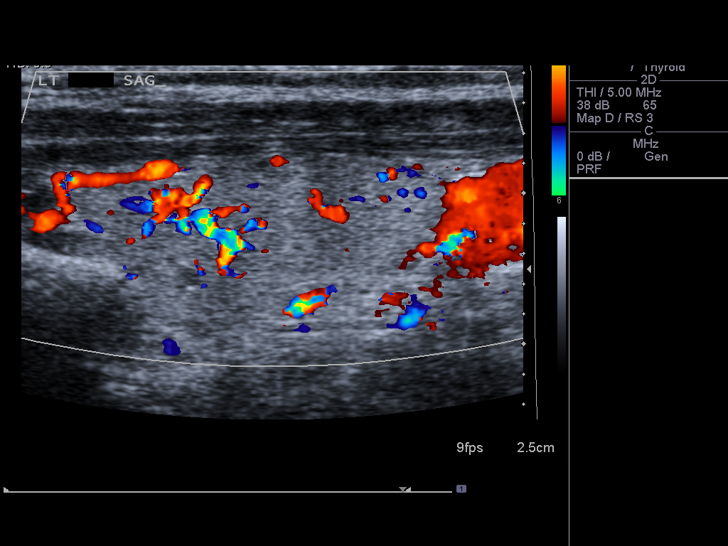
[im 28/42]
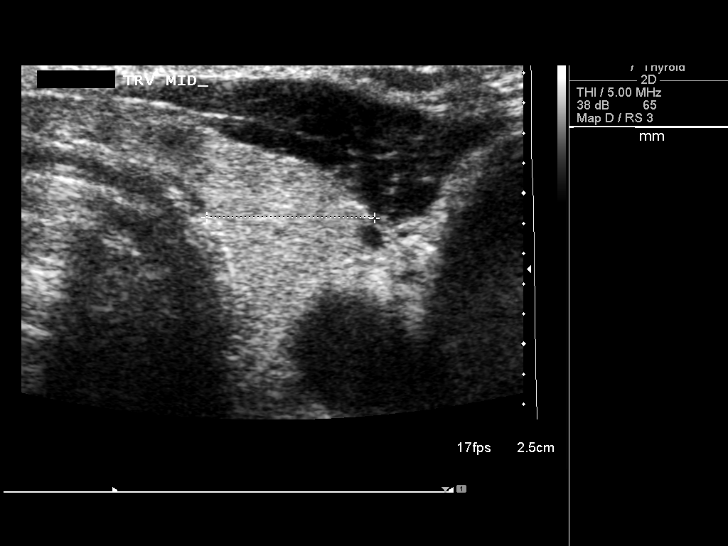
[im 31/42]
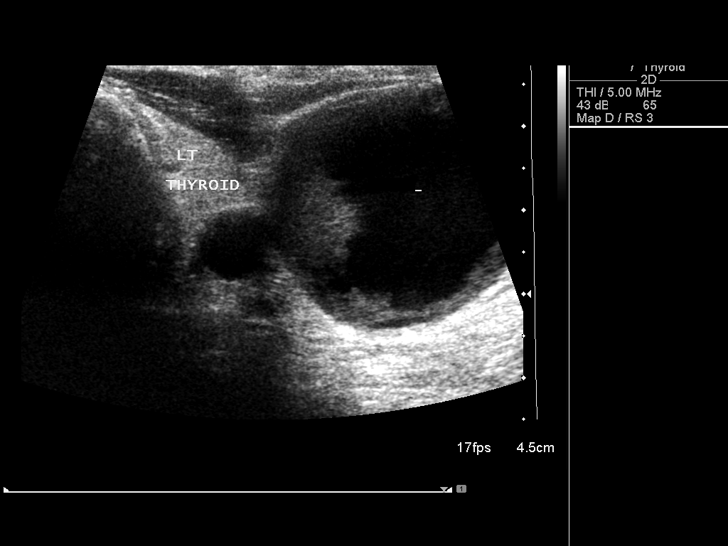
[im 35/42]
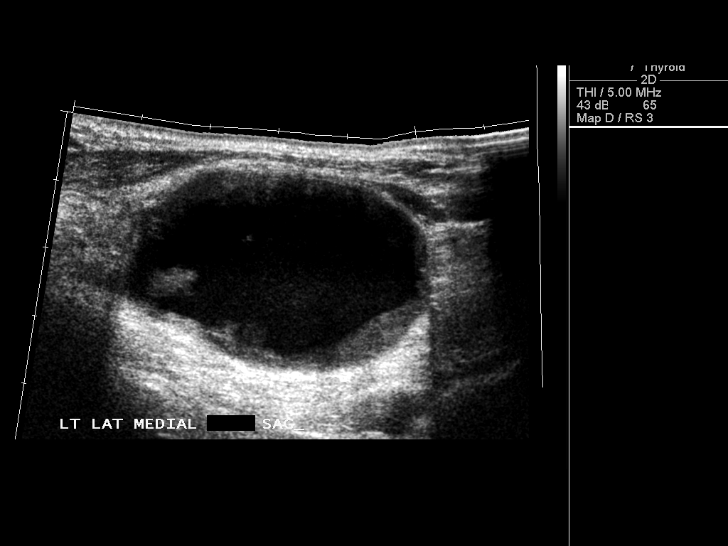
[im 38/42]
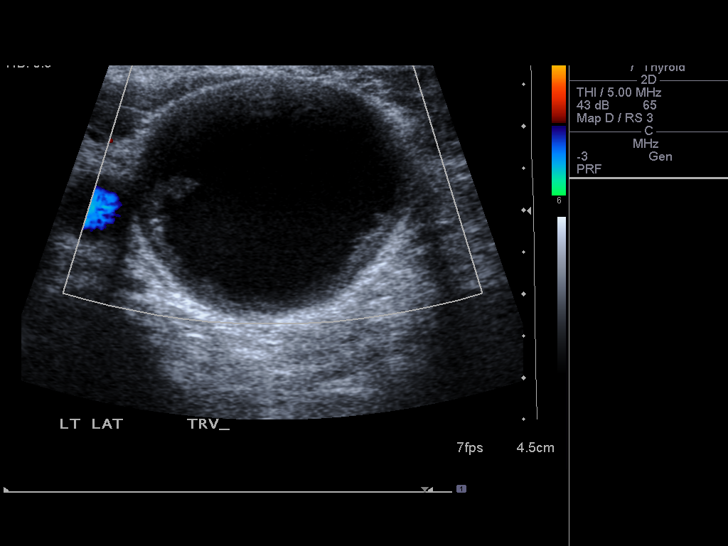
[im 42/42]
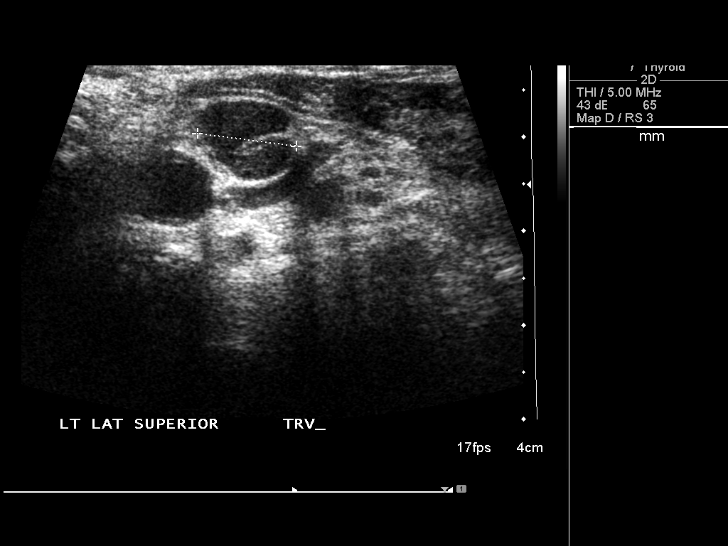

[14 of 25 positions shown; findings below may reference images not displayed]

FINDINGS: Right thyroid lobe:  4.1 x 1.0 x 1.2 cm.
Left thyroid lobe:  3.6 x 1.1 x 1.0 cm.
Isthmus:  4 mm in thickness.

Focal nodules:  The echogenicity of the thyroid parenchyma is
homogeneous.  Only tiny hypoechoic nodules are present of no more
than 3 mm in diameter.

However, just lateral to the left lobe of the thyroid there is a
rounded mass which is primarily cystic with peripheral mural
thickening.  This mass measures 4.4 x 2.9 x 3.3 cm and percutaneous
biopsy is recommended.  CT of the neck with IV contrast may be
helpful to assess for possible adenopathy.

Lymphadenopathy:  None visualized.
IMPRESSION: 1.  Negative ultrasound of the thyroid.
2.  However there is a 4.4 cm mass lateral to the left lobe of
thyroid which is somewhat cystic with mural thickening.  Consider
CT of the neck with IV contrast versus percutaneous biopsy.

## 2014-12-12 IMAGING — CT NM PET TUM IMG INITIAL (PI) SKULL BASE T - THIGH
5 series · 25 of 25 positions shown · IV contrast (350 OM)
Comparison: Neck CT 02/22/2013

CLINICAL DATA: Initial treatment strategy for head and neck
carcinoma.

EXAM:
NUCLEAR MEDICINE PET SKULL BASE TO THIGH
FASTING BLOOD GLUCOSE:  Value: [AGE] next healed neck CT
02/22/2013 mg/dl
TECHNIQUE: 20.6 mCi F-18 FDG was injected intravenously. CT data was obtained
and used for attenuation correction and anatomic localization only.
(This was not acquired as a diagnostic CT examination.) Additional
exam technical data entered on technologist worksheet.

[Series 1: pet ac · axial · 3.3mm · 4.69mm/px · z∈[-870,+0]mm · 8 of 267 slices shown]
[im 1/267]
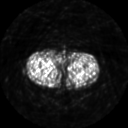
[im 39/267]
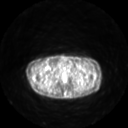
[im 77/267]
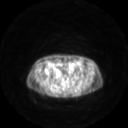
[im 115/267]
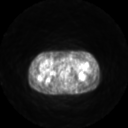
[im 153/267]
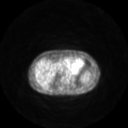
[im 191/267]
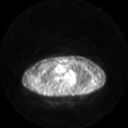
[im 229/267]
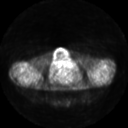
[im 267/267]
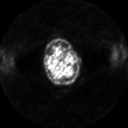

[Series 2: ct images · axial · 3.8mm · 0.98mm/px · z∈[-870,+0]mm · 7 of 267 slices shown]
[im 1/267]
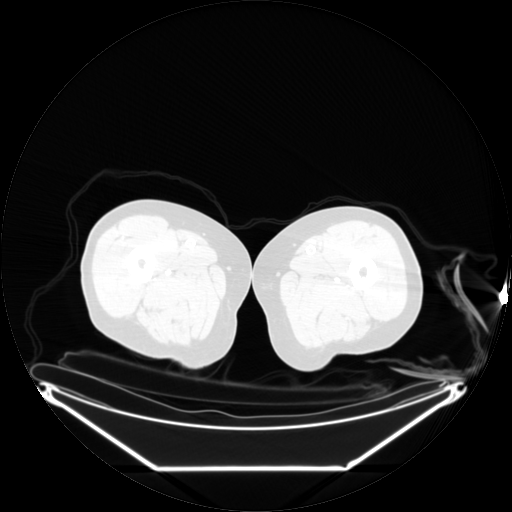
[im 45/267]
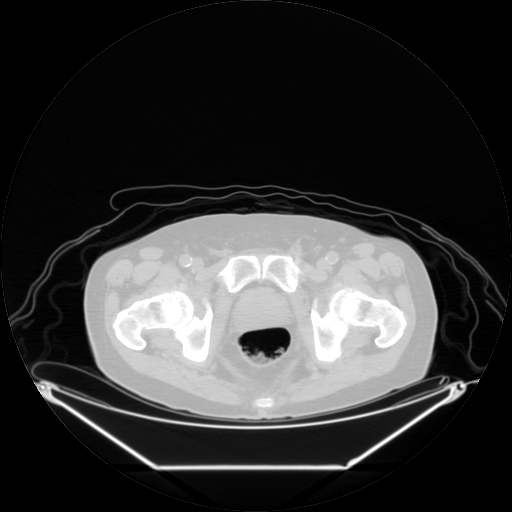
[im 89/267]
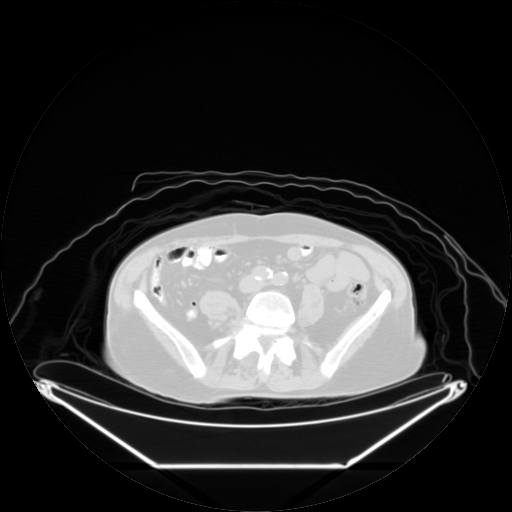
[im 134/267]
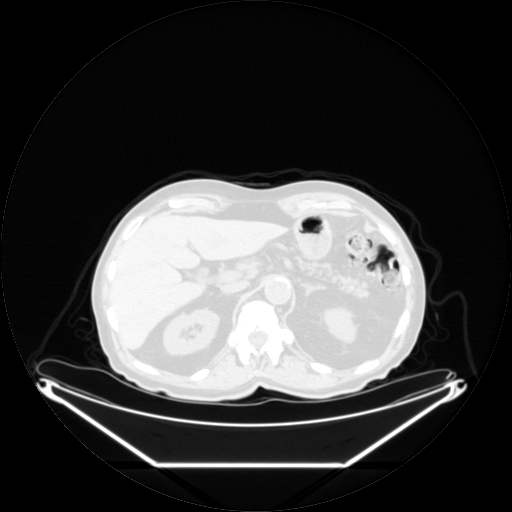
[im 178/267]
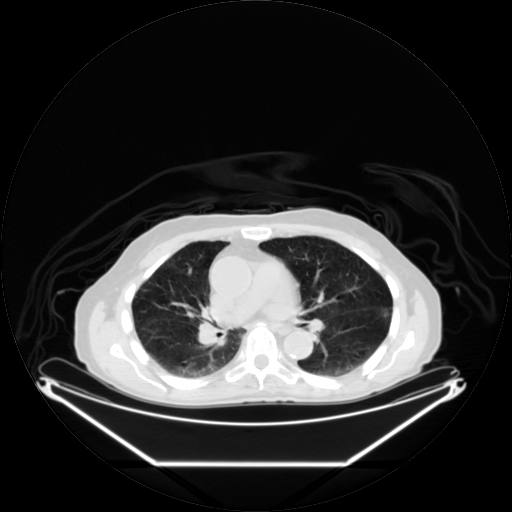
[im 222/267]
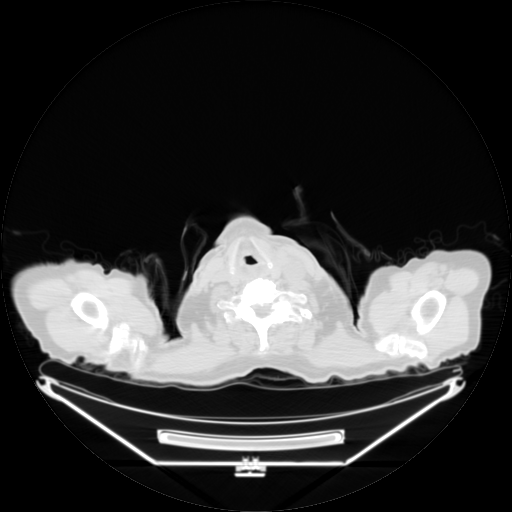
[im 267/267  brain]
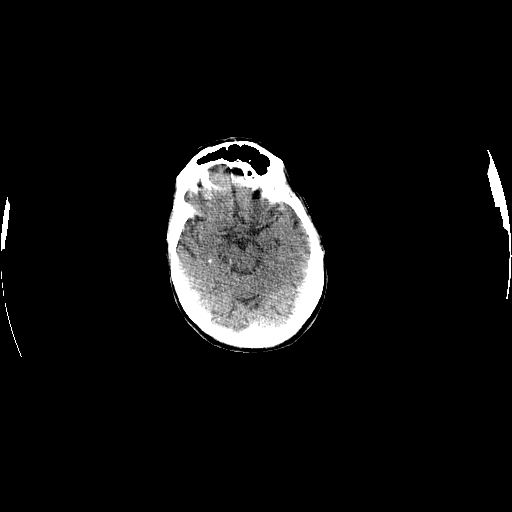

[Series 2: pet nac · axial · 3.3mm · 4.69mm/px · z∈[-870,+0]mm · 7 of 267 slices shown]
[im 1/267]
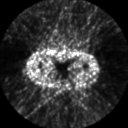
[im 45/267]
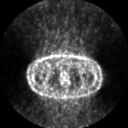
[im 89/267]
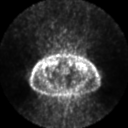
[im 134/267]
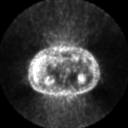
[im 178/267]
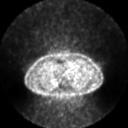
[im 222/267]
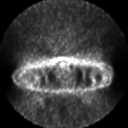
[im 267/267]
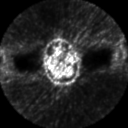

[Series 123: mip · coronal · 3.3mm · 4.69mm/px · 1 of 30 slices shown]
[im 1/30]
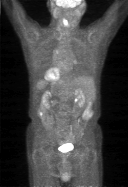

[Series 153: reformatted · coronal · 4.7mm · 6.98mm/px · 2 of 62 slices shown]
[im 1/62]
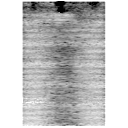
[im 62/62]
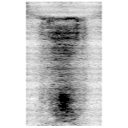

[25 of 25 positions shown; findings below may reference images not displayed]

FINDINGS: NECK

Hypermetabolic mass at the left base of tongue measuring
approximately 23 x 20 mm (image 36) with SUV max = 26.1. There is a
cluster of rounded left level 2 lymph nodes beneath the
sternocleidomastoid muscle measuring 13 and 14 mm respectively
(image 33 and 34). These enlarged lymph nodes have mild metabolic
activity for size with SUV max 5.1. Large rounded necrotic lymph
node measuring 34 x 35 mm at the level 3 station on the left has
minimal peripheral activity with SUV max = 4.9.

CHEST

No hypermetabolic mediastinal or hilar nodes. Within the posterior
right lower lobe there is a sub solid 10 mm nodule (image 91).

ABDOMEN/PELVIS

No abnormal hypermetabolic activity within the liver, pancreas,
adrenal glands, or spleen. No hypermetabolic lymph nodes in the
abdomen or pelvis.

SKELETON

No focal hypermetabolic activity to suggest skeletal metastasis.
IMPRESSION: 1. Intensely hypermetabolic left base of tongue mass consists with
primary carcinoma.

2. Rounded abnormal left level 2 and 3 lymph nodes have relatively
low metabolic activity for size but morphologically are suspicious
for local ipsilateral nodal metastasis.

3. No clear evidence of contralateral metastasis or distant
metastasis. As the presumed local nodal metastasis have relatively
low metabolic activity, this could decrease sensitivity for more
distant metastasis.

5. A 10 mm right lower lobe sub solid nodule. Recommend attention on
follow-up.

## 2014-12-28 IMAGING — CR DG CHEST 1V
1 series · 1 of 1 positions shown · non-contrast
Comparison: 02/08/2013

CLINICAL DATA: The Port-A-Cath placement

EXAM:
CHEST - 1 VIEW

[AP]
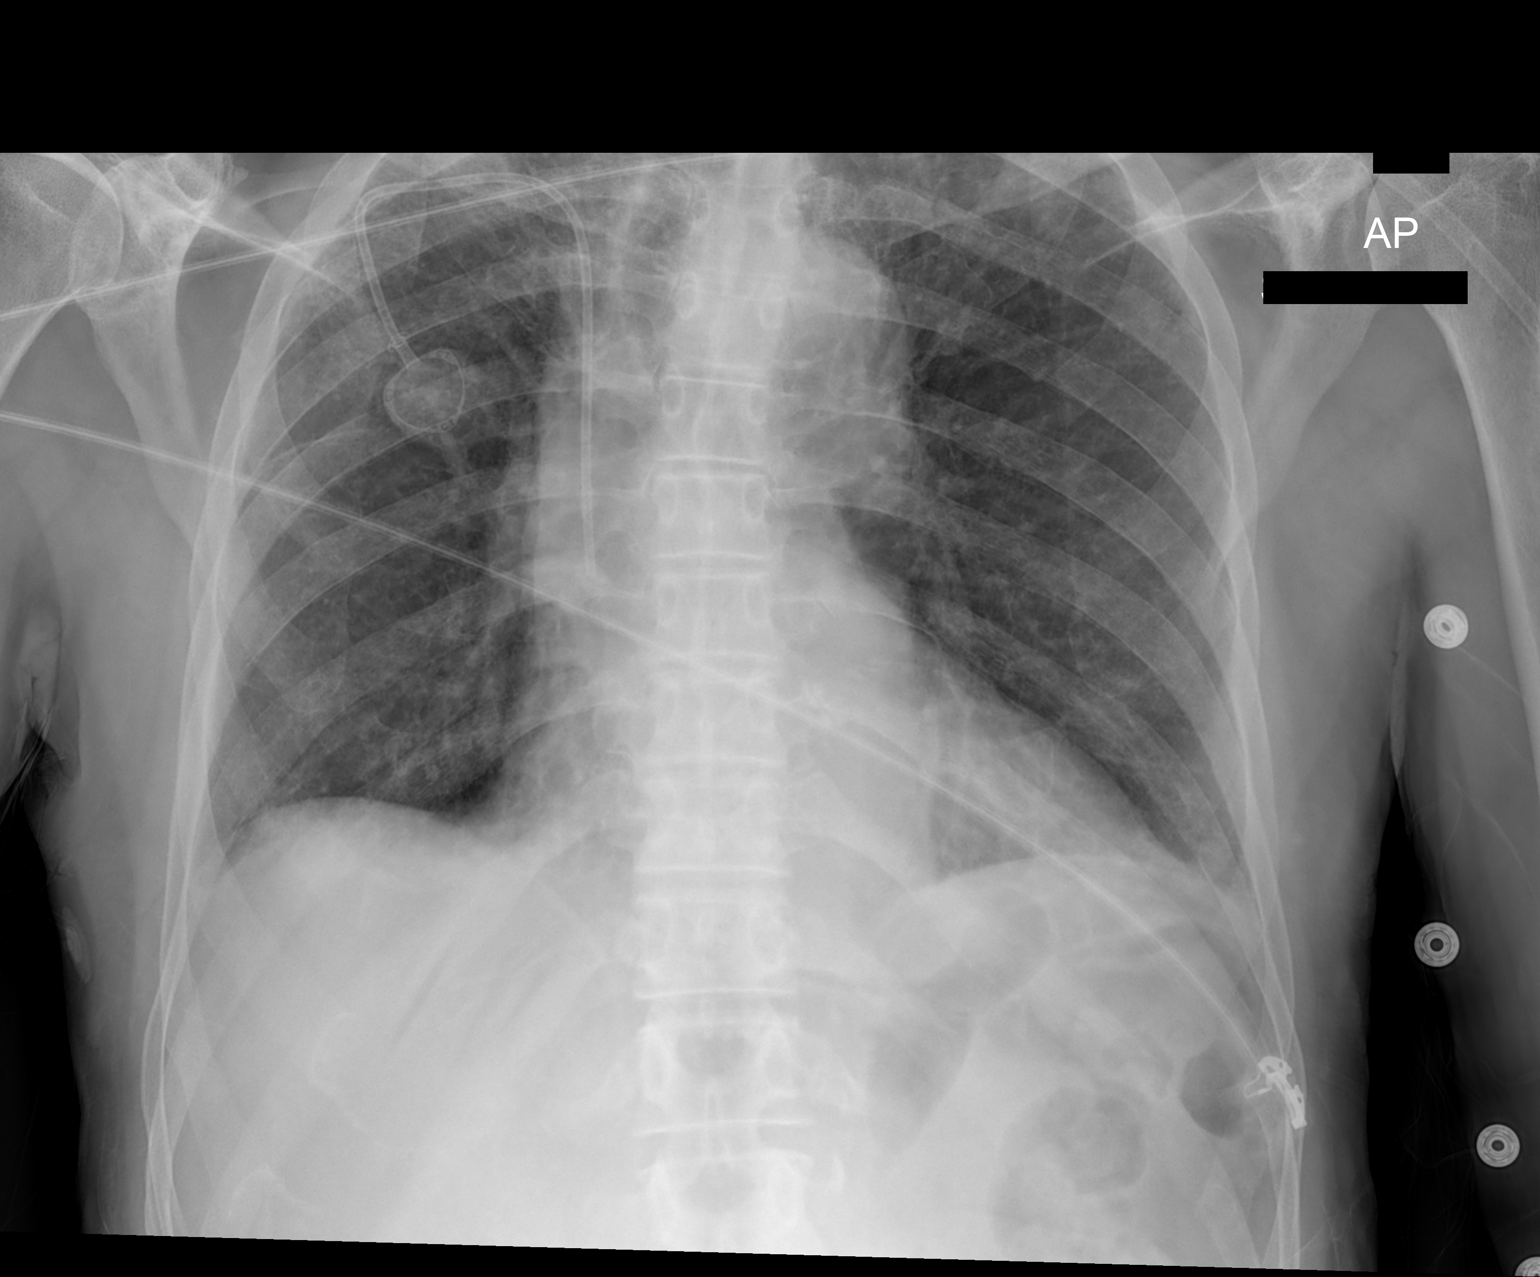

[1 of 1 positions shown; findings below may reference images not displayed]

FINDINGS: 2006 hr. New right-sided Port-A-Cath is identified. Catheter tip
position overlies the mid to distal SVC level. No evidence for
right-sided pneumothorax. Underlying chronic interstitial lung
disease is again noted. The cardio pericardial silhouette is
enlarged. Imaged bony structures of the thorax are intact. Telemetry
leads overlie the chest.
IMPRESSION: New right Port-A-Cath with tip position over the mid to distal SVC
level.
